# Patient Record
Sex: Female | Born: 2000 | Race: Black or African American | Hispanic: No | Marital: Single | Smoking: Current every day smoker
Health system: Southern US, Community
[De-identification: ages and names within clinical notes are randomized; demographics above are authoritative.]

## PROBLEM LIST (undated history)

## (undated) ENCOUNTER — Inpatient Hospital Stay (HOSPITAL_COMMUNITY): Payer: Self-pay

## (undated) ENCOUNTER — Ambulatory Visit (HOSPITAL_COMMUNITY): Payer: Self-pay | Source: Home / Self Care

## (undated) DIAGNOSIS — J45909 Unspecified asthma, uncomplicated: Secondary | ICD-10-CM

## (undated) DIAGNOSIS — F419 Anxiety disorder, unspecified: Secondary | ICD-10-CM

## (undated) HISTORY — DX: Anxiety disorder, unspecified: F41.9

---
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
## 2020-01-14 ENCOUNTER — Ambulatory Visit (HOSPITAL_COMMUNITY)
Admission: EM | Admit: 2020-01-14 | Discharge: 2020-01-14 | Disposition: A | Discharge status: Home / Self Care | Payer: BC Managed Care – PPO | Attending: Family Medicine | Admitting: Family Medicine

## 2020-01-14 ENCOUNTER — Encounter (HOSPITAL_COMMUNITY): Payer: Self-pay | Admitting: Emergency Medicine

## 2020-01-14 ENCOUNTER — Ambulatory Visit (INDEPENDENT_AMBULATORY_CARE_PROVIDER_SITE_OTHER): Payer: BC Managed Care – PPO

## 2020-01-14 ENCOUNTER — Other Ambulatory Visit: Payer: Self-pay

## 2020-01-14 DIAGNOSIS — M79641 Pain in right hand: Secondary | ICD-10-CM | POA: Diagnosis not present

## 2020-01-14 NOTE — ED Triage Notes (Signed)
Pt sts right thumb pain after over extending 3 days ago

## 2020-01-14 NOTE — Discharge Instructions (Signed)
You x ray was normal You can put ice on the injury.  Rest, ibuprofen for pain and swelling.  Follow up as needed for continued or worsening symptoms

## 2020-01-16 NOTE — ED Provider Notes (Signed)
[facility]    CSN: [id number] Arrival date & time: 01/14/20  1552      History   Chief Complaint Chief Complaint  Patient presents with  . Hand Pain    HPI Katherine Escobar is a 19 y.o. female.   Pt is a 19 year old female that presents with right thumb pain. This has been constant since falling with thumb hyperextended yesterday. Mild swelling and limited ROM. Has not done anything to treat the symptoms. No numbness, tingling.   ROS per HPI      Past Medical History:  Diagnosis Date  . Asthma     There are no problems to display for this patient.   History reviewed. No pertinent surgical history.  OB History   No obstetric history on file.      Home Medications    Prior to Admission medications   Medication Sig Start Date End Date Taking? Authorizing Provider  Cetirizine HCl 10 MG CAPS Take 1 capsule (10 mg total) by mouth at bedtime. 08/23/18   Elvina Sidle, MD  naproxen sodium (ANAPROX) 220 MG tablet Take 220 mg by mouth 2 (two) times daily as needed (pain).    [provider]  triamcinolone cream (KENALOG) 0.1 % Apply 1 application topically 2 (two) times daily. 07/18/19   Elvina Sidle, MD    Family History Family History  Family history unknown: Yes    Social History Social History   Tobacco Use  . Smoking status: Passive Smoke Exposure - Never Smoker  . Smokeless tobacco: Never Used  Substance Use Topics  . Alcohol use: No  . Drug use: No     Allergies   Patient has no known allergies.   Review of Systems Review of Systems   Physical Exam Triage Vital Signs ED Triage Vitals  Enc Vitals Group     BP 01/14/20 1707 (!) 153/77     Pulse Rate 01/14/20 1707 87     Resp 01/14/20 1707 18     Temp 01/14/20 1707 98.1 F (36.7 C)     Temp Source 01/14/20 1707 Oral     SpO2 01/14/20 1707 100 %     Weight --      Height --      Head Circumference --      Peak Flow --      Pain Score 01/14/20 1708 8     Pain  Loc --      Pain Edu? --      Excl. in GC? --    No data found.  Updated Vital Signs BP (!) 153/77 (BP Location: Right Arm)   Pulse 87   Temp 98.1 F (36.7 C) (Oral)   Resp 18   SpO2 100%   Visual Acuity Right Eye Distance:   Left Eye Distance:   Bilateral Distance:    Right Eye Near:   Left Eye Near:    Bilateral Near:     Physical Exam Vitals and nursing note reviewed.  Constitutional:      General: She is not in acute distress.    Appearance: Normal appearance. She is not ill-appearing, toxic-appearing or diaphoretic.  HENT:     Head: Normocephalic.     Nose: Nose normal.  Eyes:     Conjunctiva/sclera: Conjunctivae normal.  Pulmonary:     Effort: Pulmonary effort is normal.  Musculoskeletal:        General: Normal range of motion.     Cervical back: Normal range of  motion.     Comments: Mild tenderness to right thumb, generalized with mild swelling. No deformity or bruising.   Skin:    General: Skin is warm and dry.     Findings: No rash.  Neurological:     Mental Status: She is alert.  Psychiatric:        Mood and Affect: Mood normal.      UC Treatments / Results  Labs (all labs ordered are listed, but only abnormal results are displayed) Labs Reviewed - No data to display  EKG   Radiology DG Finger Thumb Right  Result Date: 01/14/2020 CLINICAL DATA:  Right thumb pain for 3 days EXAM: RIGHT THUMB 2+V COMPARISON:  None. FINDINGS: There is no evidence of fracture or dislocation. There is no evidence of arthropathy or other focal bone abnormality. Soft tissues are unremarkable. IMPRESSION: Negative. Electronically Signed   By: Davina Poke D.O.   On: 01/14/2020 17:19    Procedures Procedures (including critical care time)  Medications Ordered in UC Medications - No data to display  Initial Impression / Assessment and Plan / UC Course  I have reviewed the triage vital signs and the nursing notes.  Pertinent labs & imaging results that were  available during my care of the patient were reviewed by me and considered in my medical decision making (see chart for details).     Right hand pain- x ray negative Rest, ice, ibuprofen Pt requesting splint Given thumb spica here  Follow up as needed for continued or worsening symptoms  Final Clinical Impressions(s) / UC Diagnoses   Final diagnoses:  Hand pain, right     Discharge Instructions     You x ray was normal You can put ice on the injury.  Rest, ibuprofen for pain and swelling.  Follow up as needed for continued or worsening symptoms     ED Prescriptions    None     PDMP not reviewed this encounter.   Orvan July, NP 01/16/20 1112

## 2020-07-03 ENCOUNTER — Other Ambulatory Visit: Payer: Self-pay

## 2020-07-03 ENCOUNTER — Ambulatory Visit (HOSPITAL_COMMUNITY)
Admission: EM | Admit: 2020-07-03 | Discharge: 2020-07-03 | Disposition: A | Discharge status: Home / Self Care | Payer: BC Managed Care – PPO | Attending: Urgent Care | Admitting: Urgent Care

## 2020-07-03 ENCOUNTER — Encounter (HOSPITAL_COMMUNITY): Payer: Self-pay

## 2020-07-03 DIAGNOSIS — R1110 Vomiting, unspecified: Secondary | ICD-10-CM | POA: Diagnosis not present

## 2020-07-03 DIAGNOSIS — R1084 Generalized abdominal pain: Secondary | ICD-10-CM | POA: Diagnosis not present

## 2020-07-03 DIAGNOSIS — Z3202 Encounter for pregnancy test, result negative: Secondary | ICD-10-CM

## 2020-07-03 DIAGNOSIS — N926 Irregular menstruation, unspecified: Secondary | ICD-10-CM

## 2020-07-03 DIAGNOSIS — R112 Nausea with vomiting, unspecified: Secondary | ICD-10-CM

## 2020-07-03 DIAGNOSIS — R109 Unspecified abdominal pain: Secondary | ICD-10-CM

## 2020-07-03 LAB — POCT URINALYSIS DIPSTICK, ED / UC
Bilirubin Urine: NEGATIVE
Glucose, UA: NEGATIVE mg/dL
Hgb urine dipstick: NEGATIVE
Ketones, ur: NEGATIVE mg/dL
Leukocytes,Ua: NEGATIVE
Nitrite: NEGATIVE
Protein, ur: NEGATIVE mg/dL
Specific Gravity, Urine: 1.025 (ref 1.005–1.030)
Urobilinogen, UA: 0.2 mg/dL (ref 0.0–1.0)
pH: 7 (ref 5.0–8.0)

## 2020-07-03 LAB — POC URINE PREG, ED: Preg Test, Ur: NEGATIVE

## 2020-07-03 NOTE — Discharge Instructions (Signed)
Try to establish care with a gynecologist and see about more work up for your irregular cycle. They may be able to do a blood pregnancy test as well. Do not use any nonsteroidal anti-inflammatories (NSAIDs) like ibuprofen, Motrin, naproxen, Aleve, etc. which are all available over-the-counter.  Please just use Tylenol at a dose of 500mg -650mg  once every 6 hours as needed for your aches, pains, fevers.

## 2020-07-03 NOTE — ED Provider Notes (Signed)
[facility]   MRN: [id number] DOB: 09-25-2001  Subjective:   Katherine Escobar is a 19 y.o. female presenting for having had an irregular cycle this month on 06/11/2020.  Patient states she had bleeding for 1 day.  This not normal for her.  She also has had some intermittent abdominal cramping, nausea and vomiting in the past 2 days.  She took multiple home pregnancy test and some were negative, so more positive.  She would like to have this confirmed today.  No current facility-administered medications for this encounter.  Current Outpatient Medications:  .  Cetirizine HCl 10 MG CAPS, Take 1 capsule (10 mg total) by mouth at bedtime., Disp: 30 capsule, Rfl: 1 .  naproxen sodium (ANAPROX) 220 MG tablet, Take 220 mg by mouth 2 (two) times daily as needed (pain)., Disp: , Rfl:  .  triamcinolone cream (KENALOG) 0.1 %, Apply 1 application topically 2 (two) times daily., Disp: 453 g, Rfl: 0   No Known Allergies  Past Medical History:  Diagnosis Date  . Asthma      History reviewed. No pertinent surgical history.  Family History  Problem Relation Age of Onset  . Hypertension Mother   . Hyperlipidemia Mother     Social History   Tobacco Use  . Smoking status: Passive Smoke Exposure - Never Smoker  . Smokeless tobacco: Never Used  Substance Use Topics  . Alcohol use: Not Currently  . Drug use: Not Currently    ROS   Objective:   Vitals: BP 127/81 (BP Location: Right Arm)   Pulse 72   Temp 98.6 F (37 C) (Oral)   Resp 19   LMP 06/11/2020   SpO2 100%   Physical Exam Constitutional:      General: She is not in acute distress.    Appearance: Normal appearance. She is well-developed and normal weight. She is not ill-appearing, toxic-appearing or diaphoretic.  HENT:     Head: Normocephalic and atraumatic.     Right Ear: External ear normal.     Left Ear: External ear normal.     Nose: Nose normal.     Mouth/Throat:     Mouth: Mucous membranes are moist.      Pharynx: Oropharynx is clear.  Eyes:     General: No scleral icterus.    Extraocular Movements: Extraocular movements intact.     Pupils: Pupils are equal, round, and reactive to light.  Cardiovascular:     Rate and Rhythm: Normal rate and regular rhythm.     Heart sounds: Normal heart sounds. No murmur heard.  No friction rub. No gallop.   Pulmonary:     Effort: Pulmonary effort is normal. No respiratory distress.     Breath sounds: Normal breath sounds. No stridor. No wheezing, rhonchi or rales.  Abdominal:     General: Bowel sounds are normal. There is no distension.     Palpations: Abdomen is soft. There is no mass.     Tenderness: There is abdominal tenderness (generalized). There is no right CVA tenderness, left CVA tenderness, guarding or rebound.  Skin:    General: Skin is warm and dry.     Coloration: Skin is not pale.     Findings: No rash.  Neurological:     General: No focal deficit present.     Mental Status: She is alert and oriented to person, place, and time.  Psychiatric:        Mood and Affect: Mood normal.  Behavior: Behavior normal.        Thought Content: Thought content normal.        Judgment: Judgment normal.     Results for orders placed or performed during the hospital encounter of 07/03/20 (from the past 24 hour(s))  POC urine pregnancy     Status: None   Collection Time: 07/03/20 12:27 PM  Result Value Ref Range   Preg Test, Ur NEGATIVE NEGATIVE  POC Urinalysis dipstick     Status: None   Collection Time: 07/03/20 12:29 PM  Result Value Ref Range   Glucose, UA NEGATIVE NEGATIVE mg/dL   Bilirubin Urine NEGATIVE NEGATIVE   Ketones, ur NEGATIVE NEGATIVE mg/dL   Specific Gravity, Urine 1.025 1.005 - 1.030   Hgb urine dipstick NEGATIVE NEGATIVE   pH 7.0 5.0 - 8.0   Protein, ur NEGATIVE NEGATIVE mg/dL   Urobilinogen, UA 0.2 0.0 - 1.0 mg/dL   Nitrite NEGATIVE NEGATIVE   Leukocytes,Ua NEGATIVE NEGATIVE    Assessment and Plan :   PDMP not  reviewed this encounter.  1. Nausea and vomiting, intractability of vomiting not specified, unspecified vomiting type   2. Generalized abdominal pain   3. Abdominal cramping   4. Irregular menstrual cycle     No alarming physical exam findings.  Recommended [facility] care through [facility] with a gynecologist for further work-up on a regular cycle.  Unfortunately we are not doing blood pregnancy test in the clinic as of been informed about this through our lab.  Recommend supportive care, hydration, Tylenol for aches and pains. Counseled patient on potential for adverse effects with medications prescribed/recommended today, ER and return-to-clinic precautions discussed, patient verbalized understanding.    Wallis Bamberg, PA-C 07/03/20 1247

## 2020-07-03 NOTE — ED Triage Notes (Signed)
Pt is here after a 1 day period on 06/11/2020, pt states this is not normal for her cycle. Pt vomited twice yesterday morning & wants a pregnancy test today

## 2020-12-22 ENCOUNTER — Encounter: Payer: Self-pay | Admitting: Emergency Medicine

## 2020-12-22 ENCOUNTER — Other Ambulatory Visit: Payer: Self-pay

## 2020-12-22 ENCOUNTER — Ambulatory Visit
Admission: EM | Admit: 2020-12-22 | Discharge: 2020-12-22 | Disposition: A | Discharge status: Home / Self Care | Payer: BC Managed Care – PPO | Attending: Family Medicine | Admitting: Family Medicine

## 2020-12-22 DIAGNOSIS — R519 Headache, unspecified: Secondary | ICD-10-CM | POA: Diagnosis not present

## 2020-12-22 DIAGNOSIS — Z3202 Encounter for pregnancy test, result negative: Secondary | ICD-10-CM

## 2020-12-22 LAB — POCT URINE PREGNANCY: Preg Test, Ur: NEGATIVE

## 2020-12-22 NOTE — ED Triage Notes (Signed)
Patient c/o vomiting and headache x 3 days.   Patient endorses she wants a pregnancy test.   Patient states she had some bleeding 3 days ago which subsided quickly.   Patient denies any ABD pain.   Patient denies taking any BC methods.

## 2020-12-24 NOTE — ED Provider Notes (Signed)
  [facility]   [id number] 12/22/20 Arrival Time: 1425  ASSESSMENT & PLAN:  1. Nonintractable headache, unspecified chronicity pattern, unspecified headache type   2. Negative pregnancy test     Tylenol for HA if needed. Reports HA has improved. UPT negative.     Follow-up Information    [facility] .   Specialty: Urgent Care Why: As needed. Contact information: [address] [id number] [address] [place] [address] [phone]              Reviewed expectations re: course of current medical issues. Questions answered. Outlined signs and symptoms indicating need for more acute intervention. Understanding verbalized. After Visit Summary given.   SUBJECTIVE: History from: patient. Katherine Escobar is a 20 y.o. female who reports a headache; mild; over past 2-3 days; comes and goes. Not worst HA of life. Requests UPT. Patient's last menstrual period was 12/19/2020. Denies: fever and difficulty breathing. Normal PO intake without n/v/d.    OBJECTIVE:  Vitals:   12/22/20 1435  BP: 113/71  Pulse: 83  Resp: 14  Temp: 98.8 F (37.1 C)  TempSrc: Oral  SpO2: 99%    General appearance: alert; no distress Eyes: PERRLA; EOMI; conjunctiva normal HENT: [place]; AT; without nasal congestion Neck: supple  Lungs: speaks full sentences without difficulty; unlabored Extremities: no edema Skin: warm and dry Neurologic: normal gait; CN 2-12 grossly intact Psychological: alert and cooperative; normal mood and affect  Labs: Results for orders placed or performed during the hospital encounter of 12/22/20  POCT urine pregnancy  Result Value Ref Range   Preg Test, Ur Negative Negative   Labs Reviewed  POCT URINE PREGNANCY    No Known Allergies  Past Medical History:  Diagnosis Date  . Asthma    Social History   Socioeconomic History  . Marital status: Single    Spouse name: Not on file  . Number of  children: Not on file  . Years of education: Not on file  . Highest education level: Not on file  Occupational History  . Not on file  Tobacco Use  . Smoking status: Passive Smoke Exposure - Never Smoker  . Smokeless tobacco: Never Used  Substance and Sexual Activity  . Alcohol use: Not Currently  . Drug use: Not Currently  . Sexual activity: Yes    Birth control/protection: None  Other Topics Concern  . Not on file  Social History Narrative  . Not on file   Social Determinants of Health   Financial Resource Strain: Not on file  Food Insecurity: Not on file  Transportation Needs: Not on file  Physical Activity: Not on file  Stress: Not on file  Social Connections: Not on file  Intimate Partner Violence: Not on file   Family History  Problem Relation Age of Onset  . Hypertension Mother   . Hyperlipidemia Mother    History reviewed. No pertinent surgical history.   Mardella Layman, MD 12/24/20 [phone]

## 2021-02-12 ENCOUNTER — Encounter: Payer: Self-pay | Admitting: Obstetrics & Gynecology

## 2021-02-12 ENCOUNTER — Ambulatory Visit (INDEPENDENT_AMBULATORY_CARE_PROVIDER_SITE_OTHER): Payer: BC Managed Care – PPO | Admitting: Obstetrics & Gynecology

## 2021-02-12 ENCOUNTER — Other Ambulatory Visit: Payer: Self-pay

## 2021-02-12 ENCOUNTER — Other Ambulatory Visit (HOSPITAL_COMMUNITY)
Admission: RE | Admit: 2021-02-12 | Discharge: 2021-02-12 | Disposition: A | Discharge status: Home / Self Care | Payer: BC Managed Care – PPO | Source: Ambulatory Visit | Attending: Obstetrics & Gynecology | Admitting: Obstetrics & Gynecology

## 2021-02-12 VITALS — BP 134/83 | HR 88 | Ht 62.0 in | Wt 273.0 lb

## 2021-02-12 DIAGNOSIS — Z113 Encounter for screening for infections with a predominantly sexual mode of transmission: Secondary | ICD-10-CM | POA: Diagnosis not present

## 2021-02-12 DIAGNOSIS — N912 Amenorrhea, unspecified: Secondary | ICD-10-CM

## 2021-02-12 DIAGNOSIS — A749 Chlamydial infection, unspecified: Secondary | ICD-10-CM

## 2021-02-12 HISTORY — DX: Chlamydial infection, unspecified: A74.9

## 2021-02-12 LAB — POCT URINE PREGNANCY: Preg Test, Ur: NEGATIVE

## 2021-02-12 NOTE — Progress Notes (Signed)
GYNECOLOGY OFFICE VISIT NOTE  History:   Katherine Escobar is a 20 y.o. G0P0000 here today for evaluation of amenorrhea since 10/2020. Thought she was pregnant, but had negative pregnancy test in 12/2020. UPT today was also negative here in office.  She desired evaluation for this abnormal period pattern and also to discuss birth control.  Also desires STI screen.   Patient reports having occasional missed periods since menarche. Normal flow, no other concerns. She denies any abnormal vaginal discharge, bleeding, pelvic pain or other concerns.    Past Medical History:  Diagnosis Date  . Asthma     History reviewed. No pertinent surgical history.  The following portions of the patient's history were reviewed and updated as appropriate: allergies, current medications, past family history, past medical history, past social history, past surgical history and problem list.   Health Maintenance:  Unsure if she received HPV vaccine, was in [facility] as a child, will ask mother.   Review of Systems:  Pertinent items noted in HPI and remainder of comprehensive ROS otherwise negative.  Physical Exam:  BP 134/83   Pulse 88   Ht 5\' 2"  (1.575 m)   Wt 273 lb (123.8 kg)   BMI 49.93 kg/m  CONSTITUTIONAL: Well-developed, well-nourished female in no acute distress.  HEENT:  Normocephalic, atraumatic. External right and left ear normal. No scleral icterus.  NECK: Normal range of motion, supple, no masses noted on observation SKIN: No rash noted. Not diaphoretic. No erythema. No pallor. MUSCULOSKELETAL: Normal range of motion. No edema noted. NEUROLOGIC: Alert and oriented to person, place, and time. Normal muscle tone coordination. No cranial nerve deficit noted. PSYCHIATRIC: Normal mood and affect. Normal behavior. Normal judgment and thought content. CARDIOVASCULAR: Normal heart rate noted RESPIRATORY: Effort and breath sounds normal, no problems with respiration noted ABDOMEN: No masses noted. No other  overt distention noted.   PELVIC: Deferred  Labs and Imaging Results for orders placed or performed in visit on 02/12/21 (from the past 168 hour(s))  POCT urine pregnancy   Collection Time: 02/12/21  3:12 PM  Result Value Ref Range   Preg Test, Ur Negative Negative   No results found.    Assessment and Plan:      1. Amenorrhea 2.  Morbid obesity (HCC) Negative pregnancy test today.  Patient has oligomenorrhea, concerning for PCOS. Also morbidly obese. Denies having any acne or abnormal hair growth.  Will order abnormal uterine bleeding evaluation labs and pelvic ultrasound to evaluate for any structural gynecologic abnormalities.  Will contact patient with these results and plans for further evaluation/management.  Counseled patient about management of amenorrhea in the setting of morbid obesity, recommended weight loss which helps with restoring ovulatory cycles, decreases glucose intolerance with improvement of metabolic risk, improves fertility/pregnancy rates and helps with overall health.  Even modest weight loss (5 to 10 percent reduction in body weight) in women with may result in these effects.  OCPs can also be used for managing menstrual dysfunction and for providing contraception.  Will use low dose OCPs given borderline hypertension today, reevaluate in two months. She was given two samples of 20 mcg estradiol pill Tyblume. These treatments are also used for PCOS.  If she does meet criteria for PCOS and or prediabetes, may consider treatment with Metformin given its association with glucose intolerance and insulin resistance.   - POCT urine pregnancy - TSH+Prl+TestT+TestF+17OHP - 02/14/21 PELVIC COMPLETE WITH TRANSVAGINAL; Future - Hemoglobin A1c  2. Routine screening for STI (sexually transmitted infection) STI  screen done, will follow up results and manage accordingly. - Urine cytology ancillary only([place]) - Hepatitis B surface antigen - Hepatitis C antibody - HIV Antibody  (routine testing w rflx) - RPR Routine preventative health maintenance measures emphasized. Please refer to After Visit Summary for other counseling recommendations.   Return in about 2 months (around 04/14/2021) for Followup amenorrhea.    I spent 30 minutes dedicated to the care of this patient including pre-visit review of records, face to face time with the patient discussing her conditions and treatments and post visit ordering of testing.    Jaynie Collins, MD, FACOG Obstetrician & Gynecologist, [facility] for [employer], [facility]

## 2021-02-12 NOTE — Patient Instructions (Addendum)
Oral Contraception Use Oral contraceptive pills (OCPs) are medicines that prevent pregnancy. OCPs work by:  Preventing the ovaries from releasing eggs.  Thickening mucus in the lower part of the uterus (cervix). This prevents sperm from entering the uterus.  Thinning the lining of the uterus (endometrium). This prevents a fertilized egg from attaching to the endometrium. Discuss possible side effects of OCPs with your health care provider. It can take 2-3 months for your body to adjust to changes in hormone levels. What are the risks? OCPs can sometimes cause side effects, such as:  Headache.  Depression.  Trouble sleeping.  Nausea and vomiting.  Breast tenderness.  Irregular bleeding or spotting during the first several months.  Bloating or fluid retention.  Increase in blood pressure. OCPs with estrogen and progestins may slightly increase the risk of:  Blood clots.  Heart attack.  Stroke. How to take OCPs Follow instructions from your health care provider about how to take your first cycle of OCPs. There are 2 types of OCPs. The first, combination OCPs, have both estrogen and progestins. The second, progestin-only pills, have only progestin.  For combination OCPs, you may start the pill: ? On day 1 of your menstrual period. ? On the first Sunday after your period starts, or on the day you get your prescription. ? At any time of your cycle. ? If you start taking the pill within 5 days after the start of your period, you will not need a backup form of birth control, such as condoms. ? If you start at any other time of your menstrual cycle, you will need to use a backup form of birth control.  For progestin-only OCPs: ? Ideally, you can start taking the pill on the first day of your menstrual period, but you can start it on any other day too. ? These pills will protect you from pregnancy after taking it for 2 days (48 hours). You can stop using a backup form of birth  control after that time. It is important that you take this pill at the same time every day. Even taking it 3 hours late can increase the risk of pregnancy. No matter which day you start the OCP, you will always start a new pack on that same day of the week. Have an extra pack of OCPs and a backup contraceptive method available in case you miss some pills or lose your OCP pack. Missed doses Follow instructions from your health care provider for missed doses. Information about missed doses can also be found in the patient information sheet that comes with your pack of pills.  In general, for combined OCPs: ? If you forget to take the pill for 1 day, take it as soon as you can. This may mean taking 2 pills on the same day and at the same time. Take the next day's pill at the regular time. ? If you forget to take the pill for 2 days in a row, take 2 tablets on the day you remember and 2 tablets on the following day. A backup form of birth control should be used for 7 days after you are back on schedule. ? If you forget to take the pill for 3 days in a row, call your health care provider for directions on when to restart taking your pills. Do not take the missed pills. A backup form of birth control will be needed for 7 days once you restart your pills. ? If you use a pack that   contains inactive pills and you miss 1 or more of the inactive pills, you do not need to take the missed doses. Skip them and start the new pack on the regular day.  For progestin-only OCPs: ? If your dose is 3 hours or more late, or if you miss 1 or more doses, take 1 missed pill as soon as you can. ? If you miss one or more doses, you must use a backup form of birth control. Some brands of progestin-only pills recommend using a backup form of birth control for 48 hours after a missed or late dose while others recommend 7 days. If you are not sure what to do, call your health care provider or check the patient information sheet that  came with your pills.   Follow these instructions at home:  Do not use any products that contain nicotine or tobacco. These include cigarettes, chewing tobacco, or vaping devices, such as e-cigarettes. If you need help quitting, ask your health care provider.  Always use a condom to protect against STIs (sexually transmitted infections). Oral contraception pills do not protect against STIs.  Use a calendar to mark the days of your menstrual period.  Read the information sheet and directions that came with your OCP. Talk to your health care provider if you have questions. Contact a health care provider if:  You develop nausea and vomiting.  You have abnormal vaginal discharge or bleeding.  You develop a rash.  You miss your menstrual period. Depending on the type of OCP you are taking, this may be a sign of pregnancy.  You are losing your hair.  You need treatment for mood swings or depression.  You get dizzy when taking the OCP.  You develop acne after taking the OCP.  You become pregnant or think you may be pregnant.  You have diarrhea, constipation, and abdominal pain or cramps.  You are not sure what to do after missing pills. Get help right away if:  You develop chest pain.  You develop shortness of breath.  You have an uncontrolled or severe headache.  You develop numbness or slurred speech.  You develop vision or speech problems.  You develop pain, redness, and swelling in your legs.  You develop weakness or numbness in your arms or legs. These symptoms may represent a serious problem that is an emergency. Do not wait to see if the symptoms will go away. Get medical help right away. Call your local emergency services (911 in the [place] Do not drive yourself to the hospital. Summary  Oral contraceptive pills (OCPs) are medicines that you take to prevent pregnancy.  OCPs do not prevent sexually transmitted infections (STIs). Always use a condom to protect  against STIs.  When you start an OCP, be aware that it can take 2-3 months for your body to adjust to changes in hormone levels.  Read all the information and directions that come with your OCP. This information is not intended to replace advice given to you by your health care provider. Make sure you discuss any questions you have with your health care provider. Document Revised: 06/21/2020 Document Reviewed: 06/21/2020 Elsevier Patient Education  2021 Elsevier Inc.   Polycystic Ovary Syndrome  Polycystic ovarian syndrome (PCOS) is a common hormonal disorder among women of reproductive age. In most women with PCOS, small fluid-filled sacs (cysts) grow on the ovaries. PCOS can cause problems with menstrual periods and make it hard to get and stay pregnant. If this condition is  not treated, it can lead to serious health problems, such as diabetes and heart disease. What are the causes? The cause of this condition is not known. It may be due to certain factors, such as:  Irregular menstrual cycle.  High levels of certain hormones.  Problems with the hormone that helps to control blood sugar (insulin).  Certain genes. What increases the risk? You are more likely to develop this condition if you:  Have a family history of PCOS or type 2 diabetes.  Are overweight, eat unhealthy foods, and are not active. These factors may cause problems with blood sugar control, which can contribute to PCOS or PCOS symptoms. What are the signs or symptoms? Symptoms of this condition include:  Ovarian cysts and sometimes pelvic pain.  Menstrual periods that are not regular or are too heavy.  Inability to get or stay pregnant.  Increased growth of hair on the face, chest, stomach, back, thumbs, thighs, or toes.  Acne or oily skin. Acne may develop during adulthood, and it may not get better with treatment.  Weight gain or obesity.  Patches of thickened and dark brown or black skin on the neck,  arms, breasts, or thighs. How is this diagnosed? This condition is diagnosed based on:  Your medical history.  A physical exam that includes a pelvic exam. Your health care provider may look for areas of increased hair growth on your skin.  Tests, such as: ? An ultrasound to check the ovaries for cysts and to view the lining of the uterus. ? Blood tests to check levels of sugar (glucose), female hormone (testosterone), and female hormones (estrogen and progesterone). How is this treated? There is no cure for this condition, but treatment can help to manage symptoms and prevent more health problems from developing. Treatment varies depending on your symptoms and if you want to have a baby or if you need birth control. Treatment may include:  Making nutrition and lifestyle changes.  Taking the progesterone hormone to start a menstrual period.  Taking birth control pills to help you have regular menstrual periods.  Taking medicines such as: ? Medicines to make you ovulate, if you want to get pregnant. ? Medicine to reduce extra hair growth.  Having surgery in severe cases. This may involve making small holes in one or both of your ovaries. This decreases the amount of testosterone that your body makes. Follow these instructions at home:  Take over-the-counter and prescription medicines only as told by your health care provider.  Follow a healthy meal plan that includes lean proteins, complex carbohydrates, fresh fruits and vegetables, low-fat dairy products, healthy fats, and fiber.  If you are overweight, lose weight as told by your health care provider. Your health care provider can determine how much weight loss is best for you and can help you lose weight safely.  Keep all follow-up visits. This is important. Contact a health care provider if:  Your symptoms do not get better with medicine.  Your symptoms get worse or you develop new symptoms. Summary  Polycystic ovarian  syndrome (PCOS) is a common hormonal disorder among women of reproductive age.  PCOS can cause problems with menstrual periods and make it hard to get and stay pregnant.  If this condition is not treated, it can lead to serious health problems, such as diabetes and heart disease.  There is no cure for this condition, but treatment can help to manage symptoms and prevent more health problems from developing. This information is  not intended to replace advice given to you by your health care provider. Make sure you discuss any questions you have with your health care provider. Document Revised: 03/22/2020 Document Reviewed: 03/22/2020 Elsevier Patient Education  2021 Elsevier Inc.   HPV (Human Papillomavirus) Vaccine: What You Need to Know 1. Why get vaccinated? HPV (human papillomavirus) vaccine can prevent infection with some types of human papillomavirus. HPV infections can cause certain types of cancers, including:  cervical, vaginal, and vulvar cancers in women  penile cancer in men  anal cancers in both men and women  cancers of tonsils, base of tongue, and back of throat (oropharyngeal cancer) in both men and women HPV infections can also cause anogenital warts. HPV vaccine can prevent over 90% of cancers caused by HPV. HPV is spread through intimate skin-to-skin or sexual contact. HPV infections are so common that nearly all people will get at least one type of HPV at some time in their lives. Most HPV infections go away on their own within 2 years. But sometimes HPV infections will last longer and can cause cancers later in life. 2. HPV vaccine HPV vaccine is routinely recommended for adolescents at 311 or 20 years of age to ensure they are protected before they are exposed to the virus. HPV vaccine may be given beginning at age 469 years and vaccination is recommended for everyone through 20 years of age. HPV vaccine may be given to adults 27 through 20 years of age, based on  discussions between the patient and health care provider. Most children who get the first dose before 20 years of age need 2 doses of HPV vaccine. People who get the first dose at or after 20 years of age and younger people with certain immunocompromising conditions need 3 doses. Your health care provider can give you more information. HPV vaccine may be given at the same time as other vaccines. 3. Talk with your health care provider Tell your vaccination provider if the person getting the vaccine:  Has had an allergic reaction after a previous dose of HPV vaccine, or has any severe, life-threatening allergies  Is pregnant--HPV vaccine is not recommended until after pregnancy In some cases, your health care provider may decide to postpone HPV vaccination until a future visit. People with minor illnesses, such as a cold, may be vaccinated. People who are moderately or severely ill should usually wait until they recover before getting HPV vaccine. Your health care provider can give you more information. 4. Risks of a vaccine reaction  Soreness, redness, or swelling where the shot is given can happen after HPV vaccination.  Fever or headache can happen after HPV vaccination. People sometimes faint after medical procedures, including vaccination. Tell your provider if you feel dizzy or have vision changes or ringing in the ears. As with any medicine, there is a very remote chance of a vaccine causing a severe allergic reaction, other serious injury, or death. 5. What if there is a serious problem? An allergic reaction could occur after the vaccinated person leaves the clinic. If you see signs of a severe allergic reaction (hives, swelling of the face and throat, difficulty breathing, a fast heartbeat, dizziness, or weakness), call 9-1-1 and get the person to the nearest hospital. For other signs that concern you, call your health care provider. Adverse reactions should be reported to the Vaccine  Adverse Event Reporting System (VAERS). Your health care provider will usually file this report, or you can do it yourself. Visit the VAERS  website at www.vaers.LAgents.no or call 443 634 9537. VAERS is only for reporting reactions, and VAERS staff members do not give medical advice. 6. The National Vaccine Injury Compensation Program The [employer] Vaccine Injury Compensation Program (VICP) is a federal program that was created to compensate people who may have been injured by certain vaccines. Claims regarding alleged injury or death due to vaccination have a time limit for filing, which may be as short as two years. Visit the VICP website at SpiritualWord.at or call (317) 276-9531 to learn about the program and about filing a claim. 7. How can I learn more?  Ask your health care provider.  Call your local or state health department.  Visit the website of the Food and Drug Administration (FDA) for vaccine package inserts and additional information at FinderList.no.  Contact the Centers for Disease Control and Prevention (CDC): ? Call 7347859782 (1-800-CDC-INFO) or ? Visit CDC's website at PicCapture.uy. Vaccine Information Statement HPV Vaccine (06/01/2020) This information is not intended to replace advice given to you by your health care provider. Make sure you discuss any questions you have with your health care provider. Document Revised: 07/10/2020 Document Reviewed: 07/10/2020 Elsevier Patient Education  2021 [employer].     Thank you for enrolling in [place]. Please follow the instructions below to securely access your online medical record. MyChart allows you to send messages to your doctor, view your test results, manage appointments, and more.   How Do I Sign Up? 1. In your Internet browser, go to [employer] and enter [email or web address]. 2. Click on the Sign Up Now link in the Sign In box. You will see the  New Member Sign Up page. 3. Enter your MyChart Access Code exactly as it appears below. You will not need to use this code after you've completed the sign-up process. If you do not sign up before the expiration date, you must request a new code.  MyChart Access Code: [email or web address] Expires: 03/29/2021  3:35 PM  4. Enter your Social Security Number ([id number]) and Date of Birth (mm/dd/yyyy) as indicated and click Submit. You will be taken to the next sign-up page. 5. Create a MyChart ID. This will be your MyChart login ID and cannot be changed, so think of one that is secure and easy to remember. 6. Create a MyChart password. You can change your password at any time. 7. Enter your Password Reset Question and Answer. This can be used at a later time if you forget your password.  8. Enter your e-mail address. You will receive e-mail notification when new information is available in MyChart. 9. Click Sign Up. You can now view your medical record.   Additional Information Remember, MyChart is NOT to be used for urgent needs. For medical emergencies, dial 911.

## 2021-02-13 LAB — HEMOGLOBIN A1C
Est. average glucose Bld gHb Est-mCnc: 117 mg/dL
Hgb A1c MFr Bld: 5.7 % — ABNORMAL HIGH (ref 4.8–5.6)

## 2021-02-14 ENCOUNTER — Encounter: Payer: Self-pay | Admitting: Obstetrics & Gynecology

## 2021-02-14 LAB — URINE CYTOLOGY ANCILLARY ONLY
Chlamydia: POSITIVE — AB
Comment: NEGATIVE
Comment: NEGATIVE
Comment: NORMAL
Neisseria Gonorrhea: NEGATIVE
Trichomonas: NEGATIVE

## 2021-02-14 MED ORDER — DOXYCYCLINE HYCLATE 100 MG PO CAPS: 100.0000 mg | ORAL_CAPSULE | Freq: Two times a day (BID) | ORAL | 1 refills | Status: AC

## 2021-02-14 NOTE — Progress Notes (Signed)
Patient has chlamydia.  Recommend testing for other STIs, also needs to let partner(s) know so the partner(s) can get testing and treatment. Patient and sex partner(s) should abstain from unprotected sexual activity for at least seven days after everyone receives appropriate treatment.  Doxycyline was prescribed for patient.  Patient will need to return in about 4 weeks after treatment for repeat test of cure.  Please call to inform patient of results and recommendations, and advise to pick up prescription and take as directed.  Please advise patient to practice safe sex at all times.

## 2021-02-14 NOTE — Addendum Note (Signed)
Addended by: Jaynie Collins A on: 02/14/2021 03:21 PM   Modules accepted: Orders

## 2021-02-18 ENCOUNTER — Telehealth: Payer: Self-pay

## 2021-02-18 NOTE — Telephone Encounter (Signed)
Pt called and given results. Pt advised to abstain from any unprotected sexual intercourse for at least 7-10 days after pt and partner(s) receive treatment. Pt instructed to let partner(s) know so they can be tested and treated. Pt scheduled for TOC in 4 weeks per Dr. Macon Large recommendation. Pt has no further questions at this time and verbalizes understanding of results.

## 2021-02-19 ENCOUNTER — Telehealth: Payer: Self-pay

## 2021-02-19 ENCOUNTER — Other Ambulatory Visit: Payer: Self-pay

## 2021-02-19 DIAGNOSIS — A749 Chlamydial infection, unspecified: Secondary | ICD-10-CM

## 2021-02-19 MED ORDER — DOXYCYCLINE CALCIUM 50 MG/5ML PO SYRP: 100.0000 mg | ORAL_SOLUTION | Freq: Two times a day (BID) | ORAL | Status: AC

## 2021-02-19 NOTE — Telephone Encounter (Signed)
Pt called office stating she could not swallow the doxycycline capsule. Doxycycline syrup sent in per Dr. Jaynie Collins. Pt notified

## 2021-02-20 ENCOUNTER — Other Ambulatory Visit: Payer: Self-pay | Admitting: *Deleted

## 2021-02-20 ENCOUNTER — Telehealth: Payer: Self-pay

## 2021-02-20 NOTE — Progress Notes (Signed)
erroneous

## 2021-02-20 NOTE — Telephone Encounter (Signed)
Pt called the office stating her pharmacy did not have the syrup form of Doxycyline. Pharmacy states they can not get syrup form, [facility] directed me to [facility].   Called [facility] states they can not get syrup form and unsure of who does.  Called pt backed and explained syrup form is near impossible to get. Informed pt I had reached out to Jaynie Collins, MD to see an alternative.  Pt states she did not know what the medication was for and the office waited until the 26th to give her results. Advised pt that is not true, 2 attempts were made to contact her (refer to documentation) and a My Chart message was sent. Also informed pt she spoke with me on the 25th and I verbally gave her the lab results and had advised pt antibiotic was sent in. Pt requested syrup form of medication yesterday (02/19/2021).  Pt verbally states she will take the capsule form of doxycycline. Advised pt to please call the office if there is still an issue with the medication.

## 2021-02-21 LAB — TSH+PRL+TESTT+TESTF+17OHP
17-Hydroxyprogesterone: 11 ng/dL
Prolactin: 18.7 ng/mL (ref 4.8–23.3)
TSH: 0.473 u[IU]/mL (ref 0.450–4.500)
Testosterone, Free: 3.3 pg/mL (ref 0.0–4.2)
Testosterone, Total, LC/MS: 28.2 ng/dL (ref 10.0–55.0)

## 2021-02-21 LAB — RPR: RPR Ser Ql: NONREACTIVE

## 2021-02-21 LAB — HEPATITIS B SURFACE ANTIGEN: Hepatitis B Surface Ag: NEGATIVE

## 2021-02-21 LAB — HEPATITIS C ANTIBODY: Hep C Virus Ab: <0.1 s/co ratio (ref 0.0–0.9)

## 2021-02-21 LAB — HIV ANTIBODY (ROUTINE TESTING W REFLEX): HIV Screen 4th Generation wRfx: NONREACTIVE

## 2021-03-06 ENCOUNTER — Ambulatory Visit: Admission: RE | Admit: 2021-03-06 | Payer: BC Managed Care – PPO | Source: Ambulatory Visit

## 2021-03-19 ENCOUNTER — Other Ambulatory Visit (HOSPITAL_COMMUNITY)
Admission: RE | Admit: 2021-03-19 | Discharge: 2021-03-19 | Disposition: A | Discharge status: Home / Self Care | Payer: BC Managed Care – PPO | Source: Ambulatory Visit | Attending: Obstetrics & Gynecology | Admitting: Obstetrics & Gynecology

## 2021-03-19 ENCOUNTER — Other Ambulatory Visit: Payer: Self-pay

## 2021-03-19 ENCOUNTER — Ambulatory Visit (INDEPENDENT_AMBULATORY_CARE_PROVIDER_SITE_OTHER): Payer: BC Managed Care – PPO

## 2021-03-19 VITALS — BP 131/77 | HR 91

## 2021-03-19 DIAGNOSIS — Z113 Encounter for screening for infections with a predominantly sexual mode of transmission: Secondary | ICD-10-CM | POA: Diagnosis present

## 2021-03-19 DIAGNOSIS — Z8619 Personal history of other infectious and parasitic diseases: Secondary | ICD-10-CM | POA: Diagnosis not present

## 2021-03-19 DIAGNOSIS — A749 Chlamydial infection, unspecified: Secondary | ICD-10-CM | POA: Diagnosis not present

## 2021-03-19 NOTE — Progress Notes (Signed)
Patient was assessed and managed by nursing staff during this encounter. I have reviewed the chart and agree with the documentation and plan.   Jaynie Collins, MD 03/19/2021 3:41 PM

## 2021-03-19 NOTE — Progress Notes (Signed)
SUBJECTIVE:  20 y.o. presents to office for TOC.   No LMP recorded. (Menstrual status: Irregular Periods).  OBJECTIVE:  She appears well, afebrile. Urine dipstick: not done.  ASSESSMENT:  Hx of Chlamydia   PLAN:  GC, chlamydia, trichomonas, probe sent to lab. Treatment: To be determined once lab results are received

## 2021-03-21 LAB — CERVICOVAGINAL ANCILLARY ONLY
Chlamydia: NEGATIVE
Comment: NEGATIVE
Comment: NEGATIVE
Comment: NORMAL
Neisseria Gonorrhea: NEGATIVE
Trichomonas: NEGATIVE

## 2021-04-09 ENCOUNTER — Ambulatory Visit: Payer: BC Managed Care – PPO | Admitting: Obstetrics & Gynecology

## 2021-05-19 ENCOUNTER — Ambulatory Visit
Admission: EM | Admit: 2021-05-19 | Discharge: 2021-05-19 | Disposition: A | Discharge status: Home / Self Care | Payer: BC Managed Care – PPO | Attending: Urgent Care | Admitting: Urgent Care

## 2021-05-19 DIAGNOSIS — M436 Torticollis: Secondary | ICD-10-CM

## 2021-05-19 DIAGNOSIS — M62838 Other muscle spasm: Secondary | ICD-10-CM

## 2021-05-19 MED ORDER — NAPROXEN 500 MG PO TABS: 500.0000 mg | ORAL_TABLET | Freq: Two times a day (BID) | ORAL | 0 refills | Status: DC

## 2021-05-19 MED ORDER — TIZANIDINE HCL 4 MG PO TABS: 4.0000 mg | ORAL_TABLET | Freq: Four times a day (QID) | ORAL | 0 refills | Status: DC | PRN

## 2021-05-19 NOTE — ED Triage Notes (Signed)
Pt presents with left side shoulder & neck pain with no known injury; pt states she does mild lifting at work.

## 2021-05-19 NOTE — ED Provider Notes (Signed)
[facility]   MRN: [id number] DOB: Apr 11, 2001  Subjective:   Katherine Escobar is a 20 y.o. female presenting for 2-day history of acute onset persistent and worsening left-sided neck pain with shoulder pain, upper back pain.  Has not used any medications for relief.  Denies falls, trauma, numbness or tingling, weakness.  She reports that she works at [employer] where she has to stand for long periods of time and does a lot of lifting of grocery items.  No current facility-administered medications for this encounter.  Current Outpatient Medications:    Cetirizine HCl 10 MG CAPS, Take 1 capsule (10 mg total) by mouth at bedtime., Disp: 30 capsule, Rfl: 1   naproxen sodium (ANAPROX) 220 MG tablet, Take 220 mg by mouth 2 (two) times daily as needed (pain). (Patient not taking: Reported on 02/12/2021), Disp: , Rfl:    triamcinolone cream (KENALOG) 0.1 %, Apply 1 application topically 2 (two) times daily. (Patient not taking: Reported on 02/12/2021), Disp: 453 g, Rfl: 0   No Known Allergies  Past Medical History:  Diagnosis Date   Asthma    Chlamydia 02/12/2021     History reviewed. No pertinent surgical history.  Family History  Problem Relation Age of Onset   Hypertension Mother    Hyperlipidemia Mother     Social History   Tobacco Use   Smoking status: Every Day   Smokeless tobacco: Never  Vaping Use   Vaping Use: Never used  Substance Use Topics   Alcohol use: Not Currently   Drug use: Not Currently    ROS   Objective:   Vitals: BP 123/76 (BP Location: Right Arm)   Pulse 79   Temp 97.6 F (36.4 C) (Oral)   Resp 18   LMP 04/28/2021   SpO2 97%   Physical Exam Constitutional:      General: She is not in acute distress.    Appearance: Normal appearance. She is well-developed. She is not ill-appearing, toxic-appearing or diaphoretic.  HENT:     Head: Normocephalic and atraumatic.     Nose: Nose normal.     Mouth/Throat:     Mouth: Mucous  membranes are moist.     Pharynx: Oropharynx is clear.  Eyes:     General: No scleral icterus.       Right eye: No discharge.        Left eye: No discharge.     Extraocular Movements: Extraocular movements intact.     Conjunctiva/sclera: Conjunctivae normal.     Pupils: Pupils are equal, round, and reactive to light.  Cardiovascular:     Rate and Rhythm: Normal rate.  Pulmonary:     Effort: Pulmonary effort is normal.  Musculoskeletal:     Cervical back: Spasms (over trapezius), torticollis and tenderness (over area outlined) present. No swelling, edema, deformity, erythema, signs of trauma, lacerations, rigidity, bony tenderness or crepitus. Pain with movement present. Decreased range of motion.       Back:     Comments: No tenderness along the cervical paraspinal muscles.  Skin:    General: Skin is warm and dry.  Neurological:     General: No focal deficit present.     Mental Status: She is alert and oriented to person, place, and time.     Motor: No weakness.     Coordination: Coordination normal.     Gait: Gait normal.     Deep Tendon Reflexes: Reflexes normal.  Psychiatric:  Mood and Affect: Mood normal.        Behavior: Behavior normal.        Thought Content: Thought content normal.        Judgment: Judgment normal.      Assessment and Plan :   PDMP not reviewed this encounter.  1. Torticollis   2. Trapezius muscle spasm     Recommended conservative management with naproxen, tizanidine, consistent hydration.  Provided her with a note for work. Counseled patient on potential for adverse effects with medications prescribed/recommended today, ER and return-to-clinic precautions discussed, patient verbalized understanding.    Wallis Bamberg, PA-C 05/19/21 1309

## 2021-05-30 ENCOUNTER — Ambulatory Visit
Admission: EM | Admit: 2021-05-30 | Discharge: 2021-05-30 | Disposition: A | Discharge status: Home / Self Care | Payer: BC Managed Care – PPO

## 2021-06-06 ENCOUNTER — Ambulatory Visit (INDEPENDENT_AMBULATORY_CARE_PROVIDER_SITE_OTHER): Payer: BC Managed Care – PPO

## 2021-06-06 ENCOUNTER — Other Ambulatory Visit (HOSPITAL_COMMUNITY)
Admission: RE | Admit: 2021-06-06 | Discharge: 2021-06-06 | Disposition: A | Discharge status: Home / Self Care | Payer: BC Managed Care – PPO | Source: Ambulatory Visit | Attending: Obstetrics and Gynecology | Admitting: Obstetrics and Gynecology

## 2021-06-06 ENCOUNTER — Other Ambulatory Visit: Payer: Self-pay

## 2021-06-06 DIAGNOSIS — Z113 Encounter for screening for infections with a predominantly sexual mode of transmission: Secondary | ICD-10-CM | POA: Insufficient documentation

## 2021-06-06 NOTE — Progress Notes (Signed)
SUBJECTIVE:  20 y.o. female who desires a STI screen. Denies abnormal vaginal discharge, bleeding or significant pelvic pain. No UTI symptoms. Denies history of known exposure to STD.  No LMP recorded. (Menstrual status: Irregular Periods).  OBJECTIVE:  She appears well.   ASSESSMENT:  STI Screen   PLAN:  Pt offered STI blood screening-Declined GC, chlamydia, and trichomonas probe sent to lab.  Treatment: To be determined once lab results are received.  Pt follow up as needed.

## 2021-06-07 LAB — CERVICOVAGINAL ANCILLARY ONLY
Chlamydia: NEGATIVE
Comment: NEGATIVE
Comment: NEGATIVE
Comment: NORMAL
Neisseria Gonorrhea: NEGATIVE
Trichomonas: NEGATIVE

## 2021-06-12 NOTE — Progress Notes (Signed)
Patient was assessed and managed by nursing staff during this encounter. I have reviewed the chart and agree with the documentation and plan. I have also made any necessary editorial changes.  Venancio Chenier, MD 06/12/2021 9:22 AM   

## 2021-07-08 IMAGING — CR DG CHEST 2V
2 series · 2 of 2 positions shown · non-contrast
Comparison: November 14, 2017

CLINICAL DATA: Chest pain

EXAM:
CHEST - 2 VIEW

[chest pa]
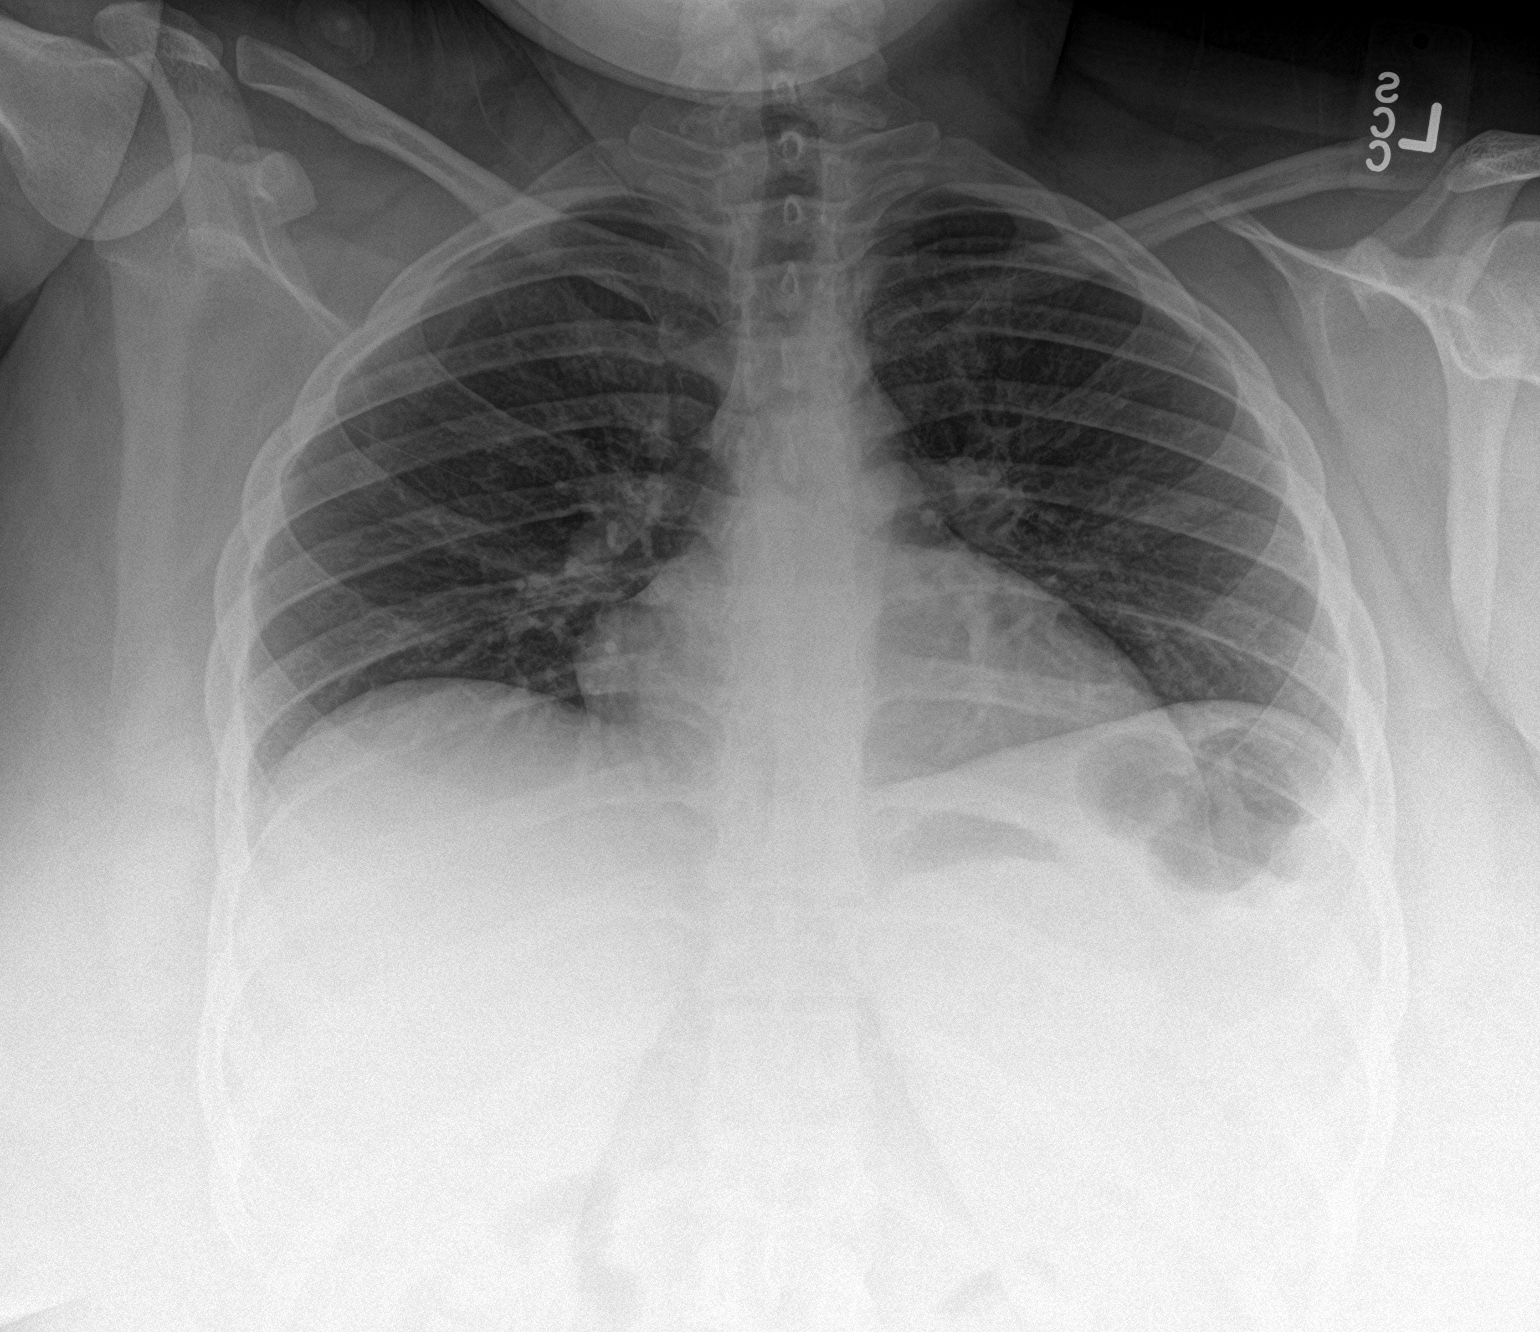

[chest lat]
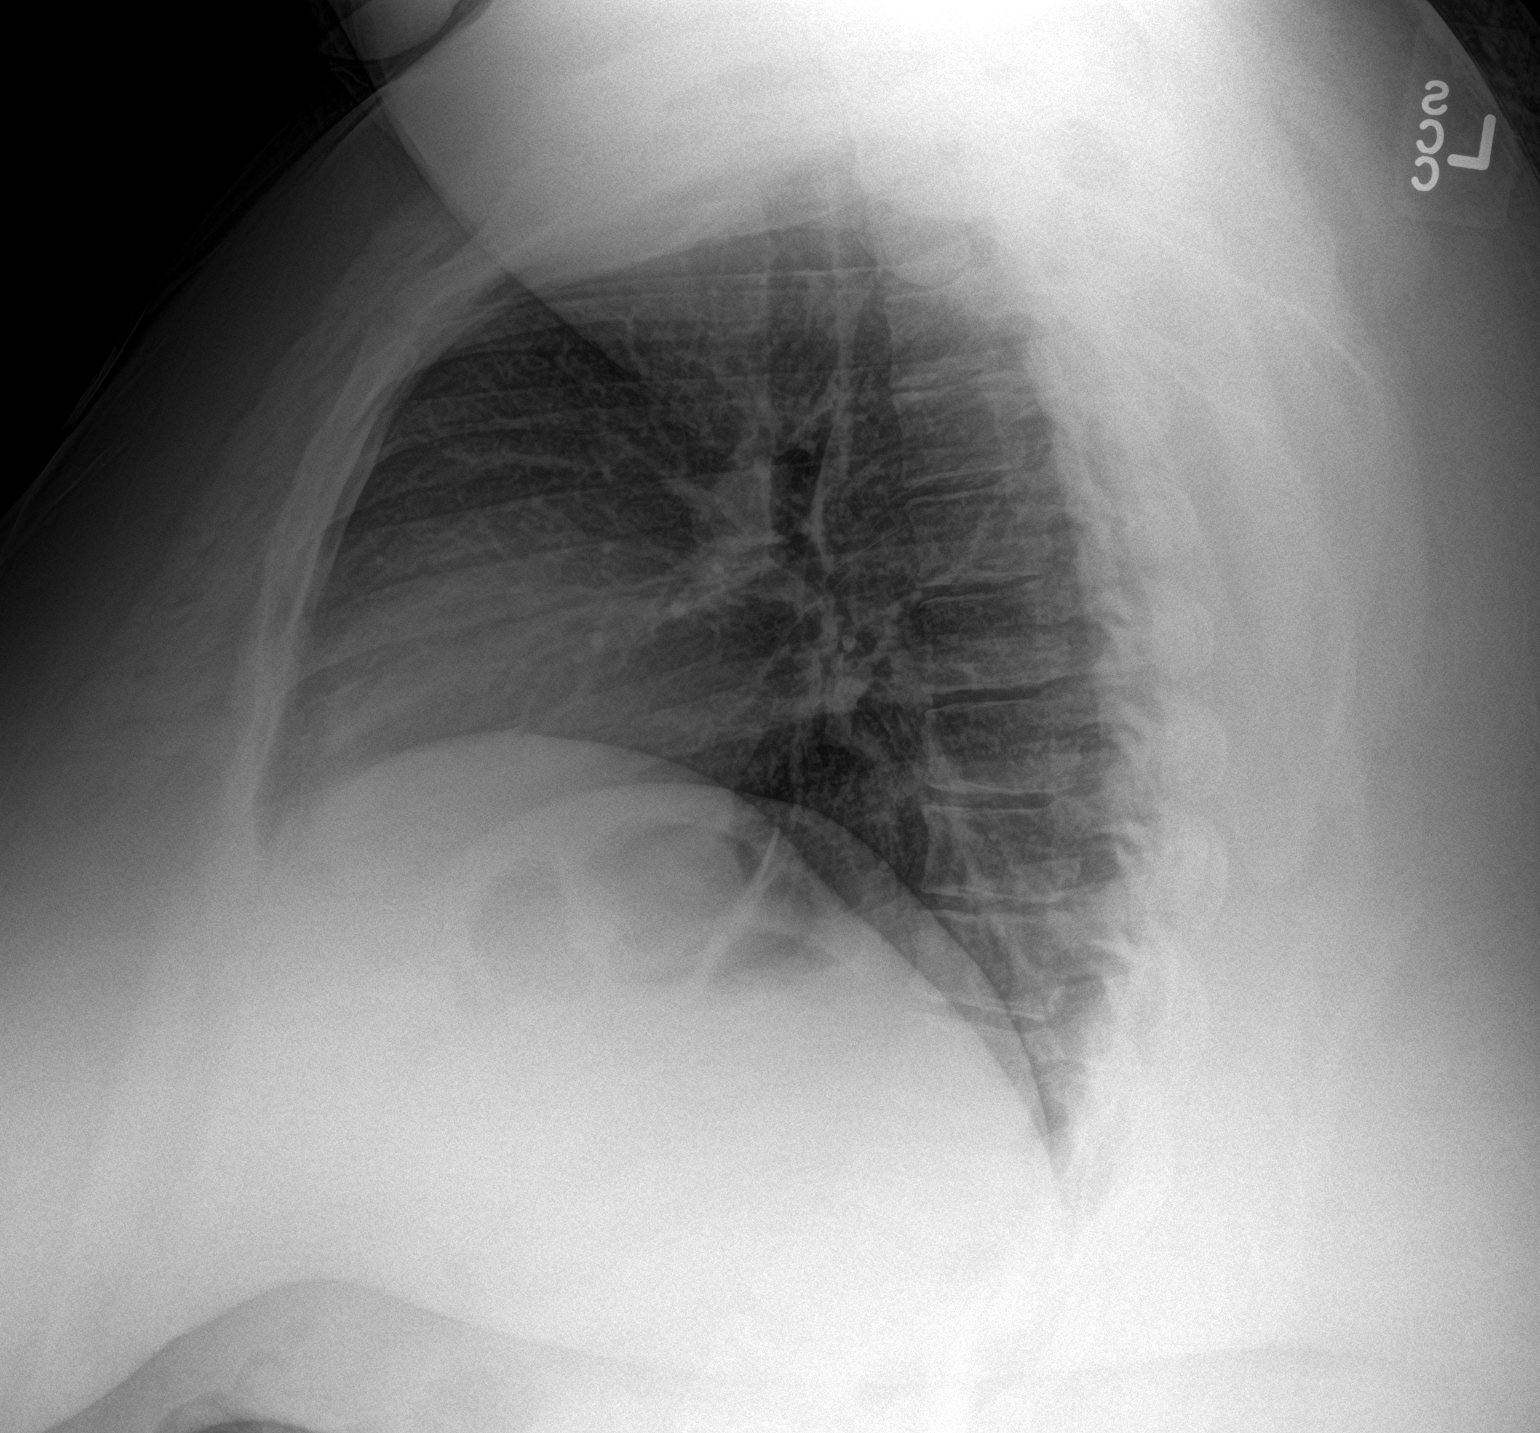

[2 of 2 positions shown; findings below may reference images not displayed]

FINDINGS: The lung volumes are low. The heart size is enlarged. There is no
pneumothorax or large pleural effusion. No definite focal
infiltrate.
IMPRESSION: Low volume chest x-ray with cardiomegaly.

## 2021-07-29 ENCOUNTER — Emergency Department (HOSPITAL_COMMUNITY)
Admission: EM | Admit: 2021-07-29 | Discharge: 2021-07-29 | Disposition: A | Discharge status: Home / Self Care | Payer: Self-pay | Attending: Emergency Medicine | Admitting: Emergency Medicine

## 2021-07-29 ENCOUNTER — Other Ambulatory Visit: Payer: Self-pay

## 2021-07-29 ENCOUNTER — Encounter: Payer: Self-pay | Admitting: Emergency Medicine

## 2021-07-29 ENCOUNTER — Encounter (HOSPITAL_COMMUNITY): Payer: Self-pay | Admitting: *Deleted

## 2021-07-29 ENCOUNTER — Ambulatory Visit
Admission: EM | Admit: 2021-07-29 | Discharge: 2021-07-29 | Disposition: A | Discharge status: Home / Self Care | Payer: BC Managed Care – PPO | Attending: Internal Medicine | Admitting: Internal Medicine

## 2021-07-29 DIAGNOSIS — J029 Acute pharyngitis, unspecified: Secondary | ICD-10-CM | POA: Insufficient documentation

## 2021-07-29 DIAGNOSIS — J069 Acute upper respiratory infection, unspecified: Secondary | ICD-10-CM | POA: Insufficient documentation

## 2021-07-29 DIAGNOSIS — Z20822 Contact with and (suspected) exposure to covid-19: Secondary | ICD-10-CM | POA: Insufficient documentation

## 2021-07-29 DIAGNOSIS — F17200 Nicotine dependence, unspecified, uncomplicated: Secondary | ICD-10-CM | POA: Insufficient documentation

## 2021-07-29 DIAGNOSIS — J45909 Unspecified asthma, uncomplicated: Secondary | ICD-10-CM | POA: Insufficient documentation

## 2021-07-29 LAB — POCT RAPID STREP A (OFFICE): Rapid Strep A Screen: NEGATIVE

## 2021-07-29 MED ORDER — PENICILLIN G BENZATHINE 1200000 UNIT/2ML IM SUSY
1.2000 10*6.[IU] | PREFILLED_SYRINGE | Freq: Once | INTRAMUSCULAR | Status: AC
  Administered 2021-07-29: 1.2 10*6.[IU] via INTRAMUSCULAR
  Filled 2021-07-29: qty 2

## 2021-07-29 MED ORDER — AMOXICILLIN 875 MG PO TABS: 875.0000 mg | ORAL_TABLET | Freq: Two times a day (BID) | ORAL | 0 refills | Status: AC

## 2021-07-29 NOTE — ED Provider Notes (Signed)
[name]    CSN: [id number] Arrival date & time: 07/29/21  0900      History   Chief Complaint Chief Complaint  Patient presents with   Sore Throat    HPI Katherine Escobar is a 20 y.o. female.   Patient presents with sore throat, cough, nasal congestion, chills that have been present for approximately 3 to 4 days.  Denies any known fevers or sick contacts.  Denies any chest pain or shortness of breath.  Patient reports that she has not taken any over-the-counter medications to help alleviate symptoms just yet.   Sore Throat   Past Medical History:  Diagnosis Date   Asthma    Chlamydia 02/12/2021    Patient Active Problem List   Diagnosis Date Noted   Chlamydia 02/12/2021    History reviewed. No pertinent surgical history.  OB History     Gravida  0   Para  0   Term  0   Preterm  0   AB  0   Living  0      SAB  0   IAB  0   Ectopic  0   Multiple  0   Live Births  0            Home Medications    Prior to Admission medications   Medication Sig Start Date End Date Taking? Authorizing Provider  amoxicillin (AMOXIL) 875 MG tablet Take 1 tablet (875 mg total) by mouth 2 (two) times daily for 10 days. 07/29/21 08/08/21 Yes Lance Muss, FNP  Cetirizine HCl 10 MG CAPS Take 1 capsule (10 mg total) by mouth at bedtime. 08/23/18   Elvina Sidle, MD  naproxen (NAPROSYN) 500 MG tablet Take 1 tablet (500 mg total) by mouth 2 (two) times daily with a meal. 05/19/21   Wallis Bamberg, PA-C  tiZANidine (ZANAFLEX) 4 MG tablet Take 1 tablet (4 mg total) by mouth every 6 (six) hours as needed for muscle spasms. 05/19/21   Wallis Bamberg, PA-C    Family History Family History  Problem Relation Age of Onset   Hypertension Mother    Hyperlipidemia Mother     Social History Social History   Tobacco Use   Smoking status: Every Day   Smokeless tobacco: Never  Vaping Use   Vaping Use: Never used  Substance Use Topics   Alcohol use: Not  Currently   Drug use: Not Currently     Allergies   Patient has no known allergies.   Review of Systems Review of Systems Per HPI  Physical Exam Triage Vital Signs ED Triage Vitals  Enc Vitals Group     BP 07/29/21 0939 129/80     Pulse Rate 07/29/21 0939 95     Resp 07/29/21 0939 16     Temp 07/29/21 0939 98.3 F (36.8 C)     Temp Source 07/29/21 0939 Oral     SpO2 07/29/21 0939 98 %     Weight --      Height --      Head Circumference --      Peak Flow --      Pain Score 07/29/21 0940 8     Pain Loc --      Pain Edu? --      Excl. in GC? --    No data found.  Updated Vital Signs BP 129/80 (BP Location: Left Arm)   Pulse 95   Temp 98.3 F (36.8 C) (Oral)  Resp 16   SpO2 98%   Visual Acuity Right Eye Distance:   Left Eye Distance:   Bilateral Distance:    Right Eye Near:   Left Eye Near:    Bilateral Near:     Physical Exam Constitutional:      General: She is not in acute distress.    Appearance: Normal appearance.  HENT:     Head: Normocephalic and atraumatic.     Right Ear: Tympanic membrane and ear canal normal.     Left Ear: Tympanic membrane and ear canal normal.     Nose: Congestion present.     Mouth/Throat:     Mouth: Mucous membranes are moist.     Pharynx: Posterior oropharyngeal erythema present. No pharyngeal swelling or uvula swelling.     Tonsils: Tonsillar exudate present. No tonsillar abscesses. 2+ on the right. 2+ on the left.  Eyes:     Extraocular Movements: Extraocular movements intact.     Conjunctiva/sclera: Conjunctivae normal.     Pupils: Pupils are equal, round, and reactive to light.  Cardiovascular:     Rate and Rhythm: Normal rate and regular rhythm.     Pulses: Normal pulses.     Heart sounds: Normal heart sounds.  Pulmonary:     Effort: Pulmonary effort is normal. No respiratory distress.     Breath sounds: Normal breath sounds. No wheezing.  Abdominal:     General: Abdomen is flat. Bowel sounds are normal.      Palpations: Abdomen is soft.  Musculoskeletal:        General: Normal range of motion.     Cervical back: Normal range of motion.  Skin:    General: Skin is warm and dry.  Neurological:     General: No focal deficit present.     Mental Status: She is alert and oriented to person, place, and time. Mental status is at baseline.  Psychiatric:        Mood and Affect: Mood normal.        Behavior: Behavior normal.     UC Treatments / Results  Labs (all labs ordered are listed, but only abnormal results are displayed) Labs Reviewed  CULTURE, GROUP A STREP ([facility])  NOVEL CORONAVIRUS, NAA  POCT RAPID STREP A (OFFICE)    EKG   Radiology No results found.  Procedures Procedures (including critical care time)  Medications Ordered in UC Medications - No data to display  Initial Impression / Assessment and Plan / UC Course  I have reviewed the triage vital signs and the nursing notes.  Pertinent labs & imaging results that were available during my care of the patient were reviewed by me and considered in my medical decision making (see chart for details).     Rapid strep test was negative, but still suspicious of strep throat given appearance of posterior pharynx and tonsils on exam.  Will treat with amoxicillin x10 days.  Discussed over-the-counter medications to help alleviate symptoms with patient as well.  Throat culture and COVID-19 viral swab are pending.  No signs of peritonsillar abscess on exam.Discussed strict return precautions. Patient verbalized understanding and is agreeable with plan.  Final Clinical Impressions(s) / UC Diagnoses   Final diagnoses:  Sore throat  Acute upper respiratory infection  Encounter for laboratory testing for COVID-19 virus     Discharge Instructions      Your rapid strep test was negative, but still suspicious of strep throat given the appearance of your throat on exam.  We will treat this with amoxicillin antibiotic.  Please pick  this up from the pharmacy.  You may also take medication such as Mucinex DM, Zyrtec, or Sudafed to help alleviate your other symptoms.  Cepacol throat lozenges are also good to help alleviate throat pain.     ED Prescriptions     Medication Sig Dispense Auth. Provider   amoxicillin (AMOXIL) 875 MG tablet Take 1 tablet (875 mg total) by mouth 2 (two) times daily for 10 days. 20 tablet Lance Muss, FNP      PDMP not reviewed this encounter.   Lance Muss, FNP 07/29/21 1026

## 2021-07-29 NOTE — Discharge Instructions (Signed)
Please read and follow all provided instructions.  Your diagnoses today include:  1. Pharyngitis, unspecified etiology     Tests performed today include: Vital signs. See below for your results today.   Medications prescribed:  You were given a one-time shot of penicillin to treat your throat infection.   Take any medications prescribed only as directed.   Home care instructions:  Please read the educational materials provided and follow any instructions contained in this packet.  Follow-up instructions: Please follow-up with your primary care provider as needed for further evaluation of your symptoms.  Return instructions:  Please return to the Emergency Department if you experience worsening symptoms.  Return if you have worsening problems swallowing, your neck becomes swollen, you cannot swallow your saliva or your voice becomes muffled.  Return with high persistent fever, persistent vomiting, or if you have trouble breathing.  Please return if you have any other emergent concerns.  Additional Information:  Your vital signs today were: BP 139/76 (BP Location: Right Arm)   Pulse 97   Temp 99 F (37.2 C) (Oral)   Resp 16   LMP 07/28/2021   SpO2 100%  If your blood pressure (BP) was elevated above 135/85 this visit, please have this repeated by your doctor within one month. --------------

## 2021-07-29 NOTE — ED Triage Notes (Signed)
Pt c/o sore throat since Friday. Pt was seen at UC this morning and prescribed pills that she cannot take

## 2021-07-29 NOTE — ED Provider Notes (Signed)
[facility] EMERGENCY DEPARTMENT Provider Note   CSN: [id number] Arrival date & time: 07/29/21  2013     History Chief Complaint  Patient presents with   Sore Throat    Katherine Escobar is a 20 y.o. female.  Patient presents emergency department for evaluation of sore throat and nasal congestion that she has had over the past 3 to 4 days.  Sore throat has progressed to the point where she cannot swallow solid foods and medications.  She went to urgent care this morning and had a strep test which was negative and a COVID test which has not resulted.  No documented fevers.  No cough or shortness of breath.  She states that she cannot take the amoxicillin tablets prescribed earlier today.  Symptoms have not changed appreciably since her visit this morning.  She cannot eat dinner tonight because of throat pain.  Onset of symptoms acute.  Course is worsening.  Nothing make symptoms better.  Swallowing makes the pain worse.      Past Medical History:  Diagnosis Date   Asthma    Chlamydia 02/12/2021    Patient Active Problem List   Diagnosis Date Noted   Chlamydia 02/12/2021    History reviewed. No pertinent surgical history.   OB History     Gravida  0   Para  0   Term  0   Preterm  0   AB  0   Living  0      SAB  0   IAB  0   Ectopic  0   Multiple  0   Live Births  0           Family History  Problem Relation Age of Onset   Hypertension Mother    Hyperlipidemia Mother     Social History   Tobacco Use   Smoking status: Every Day   Smokeless tobacco: Never  Vaping Use   Vaping Use: Never used  Substance Use Topics   Alcohol use: Not Currently   Drug use: Not Currently    Home Medications Prior to Admission medications   Medication Sig Start Date End Date Taking? Authorizing Provider  amoxicillin (AMOXIL) 875 MG tablet Take 1 tablet (875 mg total) by mouth 2 (two) times daily for 10 days. 07/29/21 08/08/21  Lance Muss,  FNP  Cetirizine HCl 10 MG CAPS Take 1 capsule (10 mg total) by mouth at bedtime. 08/23/18   Elvina Sidle, MD  naproxen (NAPROSYN) 500 MG tablet Take 1 tablet (500 mg total) by mouth 2 (two) times daily with a meal. 05/19/21   Wallis Bamberg, PA-C  tiZANidine (ZANAFLEX) 4 MG tablet Take 1 tablet (4 mg total) by mouth every 6 (six) hours as needed for muscle spasms. 05/19/21   Wallis Bamberg, PA-C    Allergies    Patient has no known allergies.  Review of Systems   Review of Systems  Constitutional:  Negative for chills, fatigue and fever.  HENT:  Positive for congestion and sore throat. Negative for ear pain, rhinorrhea and sinus pressure.   Eyes:  Negative for redness.  Respiratory:  Negative for cough and wheezing.   Gastrointestinal:  Negative for abdominal pain, diarrhea, nausea and vomiting.  Genitourinary:  Negative for dysuria.  Musculoskeletal:  Negative for myalgias and neck stiffness.  Skin:  Negative for rash.  Neurological:  Negative for headaches.  Hematological:  Negative for adenopathy.   Physical Exam Updated Vital Signs BP 139/76 (BP Location:  Right Arm)   Pulse 97   Temp 99 F (37.2 C) (Oral)   Resp 16   LMP 07/28/2021   SpO2 100%   Physical Exam Vitals and nursing note reviewed.  Constitutional:      Appearance: She is well-developed.  HENT:     Head: Normocephalic and atraumatic.     Jaw: No trismus.     Right Ear: Tympanic membrane, ear canal and external ear normal.     Left Ear: Tympanic membrane, ear canal and external ear normal.     Nose: Nose normal. No mucosal edema or rhinorrhea.     Mouth/Throat:     Mouth: Mucous membranes are moist. Mucous membranes are not dry. No oral lesions.     Pharynx: Uvula midline. Oropharyngeal exudate and posterior oropharyngeal erythema present. No uvula swelling.     Tonsils: Tonsillar exudate present. No tonsillar abscesses. 3+ on the right. 3+ on the left.  Eyes:     General:        Right eye: No discharge.         Left eye: No discharge.     Conjunctiva/sclera: Conjunctivae normal.  Cardiovascular:     Rate and Rhythm: Normal rate and regular rhythm.     Heart sounds: Normal heart sounds.  Pulmonary:     Effort: Pulmonary effort is normal. No respiratory distress.     Breath sounds: Normal breath sounds. No wheezing or rales.  Abdominal:     Palpations: Abdomen is soft.     Tenderness: There is no abdominal tenderness.  Musculoskeletal:     Cervical back: Normal range of motion and neck supple.  Lymphadenopathy:     Cervical: No cervical adenopathy.  Skin:    General: Skin is warm and dry.  Neurological:     Mental Status: She is alert.  Psychiatric:        Mood and Affect: Mood normal.    ED Results / Procedures / Treatments   Labs (all labs ordered are listed, but only abnormal results are displayed) Labs Reviewed - No data to display  EKG None  Radiology No results found.  Procedures Procedures   Medications Ordered in ED Medications  penicillin g benzathine (BICILLIN LA) 1200000 UNIT/2ML injection 1.2 Million Units (has no administration in time range)    ED Course  I have reviewed the triage vital signs and the nursing notes.  Pertinent labs & imaging results that were available during my care of the patient were reviewed by me and considered in my medical decision making (see chart for details).  Patient seen and examined.  Reviewed work-up from urgent care earlier today.  As there was concern for pharyngitis, patient offered IM Bicillin.  She accepts.  Recommended children's Tylenol or ibuprofen for pain control, soft foods, warm tea.  Stressed importance of staying hydrated.  Encouraged return with worsening pain, high fevers, other concerns.  Vital signs reviewed and are as follows: BP 139/76 (BP Location: Right Arm)   Pulse 97   Temp 99 F (37.2 C) (Oral)   Resp 16   LMP 07/28/2021   SpO2 100%      MDM Rules/Calculators/A&P                            Patient with tonsillar swelling and exudates consistent with tonsillitis.  She is not able to tolerate her medications prescribed earlier today, prompting visit.  She was offered and administered IM  Bicillin.  No signs of peritonsillar abscess.  No signs of respiratory compromise or deep space tissue infection in neck.  Patient appears well, nontoxic.   Final Clinical Impression(s) / ED Diagnoses Final diagnoses:  Pharyngitis, unspecified etiology    Rx / DC Orders ED Discharge Orders     None        Renne Crigler, Cordelia Poche 07/29/21 2049    Milagros Loll, MD 07/29/21 2219

## 2021-07-29 NOTE — ED Triage Notes (Signed)
Cough, sore throat, chills, runny nose, nasal congestion since friday

## 2021-07-29 NOTE — Discharge Instructions (Addendum)
Your rapid strep test was negative, but still suspicious of strep throat given the appearance of your throat on exam.  We will treat this with amoxicillin antibiotic.  Please pick this up from the pharmacy.  You may also take medication such as Mucinex DM, Zyrtec, or Sudafed to help alleviate your other symptoms.  Cepacol throat lozenges are also good to help alleviate throat pain.

## 2021-07-30 LAB — NOVEL CORONAVIRUS, NAA: SARS-CoV-2, NAA: NOT DETECTED

## 2021-07-30 LAB — SARS-COV-2, NAA 2 DAY TAT

## 2021-07-31 LAB — CULTURE, GROUP A STREP (THRC)

## 2021-08-13 ENCOUNTER — Other Ambulatory Visit: Payer: Self-pay

## 2021-08-13 ENCOUNTER — Telehealth: Payer: BC Managed Care – PPO | Admitting: Obstetrics & Gynecology

## 2021-08-19 ENCOUNTER — Encounter: Payer: Self-pay | Admitting: Emergency Medicine

## 2021-08-19 ENCOUNTER — Ambulatory Visit
Admission: EM | Admit: 2021-08-19 | Discharge: 2021-08-19 | Disposition: A | Discharge status: Home / Self Care | Payer: Self-pay | Attending: Physician Assistant | Admitting: Physician Assistant

## 2021-08-19 ENCOUNTER — Other Ambulatory Visit: Payer: Self-pay

## 2021-08-19 DIAGNOSIS — J069 Acute upper respiratory infection, unspecified: Secondary | ICD-10-CM

## 2021-08-19 NOTE — ED Triage Notes (Signed)
Patient c/o non-productive, sneezing, runny nose, congestion.  Patient exposed to RSV.  Patient is not vaccinated COVID.

## 2021-08-19 NOTE — ED Provider Notes (Signed)
[name]    CSN: [id number] Arrival date & time: 08/19/21  4097      History   Chief Complaint Chief Complaint  Patient presents with   Exposed to RSV    HPI Katherine Escobar is a 20 y.o. female.   Patient here today for evaluation of few day history of sneezing, congestion and cough. 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 She has not had fever above 99.70F. She denies any vomiting or diarrhea. She has tried OTC meds without resolution.     Past Medical History:  Diagnosis Date   Asthma    Chlamydia 02/12/2021    Patient Active Problem List   Diagnosis Date Noted   Chlamydia 02/12/2021    History reviewed. No pertinent surgical history.  OB History     Gravida  0   Para  0   Term  0   Preterm  0   AB  0   Living  0      SAB  0   IAB  0   Ectopic  0   Multiple  0   Live Births  0            Home Medications    Prior to Admission medications   Medication Sig Start Date End Date Taking? Authorizing Provider  Cetirizine HCl 10 MG CAPS Take 1 capsule (10 mg total) by mouth at bedtime. 08/23/18   Elvina Sidle, MD  naproxen (NAPROSYN) 500 MG tablet Take 1 tablet (500 mg total) by mouth 2 (two) times daily with a meal. 05/19/21   Wallis Bamberg, PA-C  tiZANidine (ZANAFLEX) 4 MG tablet Take 1 tablet (4 mg total) by mouth every 6 (six) hours as needed for muscle spasms. 05/19/21   Wallis Bamberg, PA-C    Family History Family History  Problem Relation Age of Onset   Hypertension Mother    Hyperlipidemia Mother     Social History Social History   Tobacco Use   Smoking status: Every Day   Smokeless tobacco: Never  Vaping Use   Vaping Use: Never used  Substance Use Topics   Alcohol use: Not Currently   Drug use: Not Currently     Allergies   Patient has no known allergies.   Review of Systems Review of Systems  Constitutional:  Negative  for chills and fever.  HENT:  Positive for congestion, sinus pressure and sore throat. Negative for ear pain.   Eyes:  Negative for discharge and redness.  Respiratory:  Positive for cough. Negative for shortness of breath and wheezing.   Gastrointestinal:  Negative for abdominal pain, diarrhea, nausea and vomiting.    Physical Exam Triage Vital Signs ED Triage Vitals  Enc Vitals Group     BP      Pulse      Resp      Temp      Temp src      SpO2      Weight      Height      Head Circumference      Peak Flow      Pain Score      Pain Loc      Pain Edu?      Excl. in GC?    No data found.  Updated Vital Signs BP (!) 143/77 (BP Location: Left Arm)   Pulse 85  Temp 98.7 F (37.1 C) (Oral)   Ht 5\' 3"  (1.6 m)   Wt 260 lb (117.9 kg)   LMP 07/28/2021   SpO2 98%   BMI 46.06 kg/m   Physical Exam Vitals and nursing note reviewed.  Constitutional:      General: She is not in acute distress.    Appearance: Normal appearance. She is not ill-appearing.  HENT:     Head: Normocephalic and atraumatic.     Right Ear: Tympanic membrane normal.     Left Ear: Tympanic membrane normal.     Nose: Congestion present.     Mouth/Throat:     Mouth: Mucous membranes are moist.     Pharynx: No oropharyngeal exudate or posterior oropharyngeal erythema.  Eyes:     Conjunctiva/sclera: Conjunctivae normal.  Cardiovascular:     Rate and Rhythm: Normal rate and regular rhythm.     Heart sounds: Normal heart sounds. No murmur heard. Pulmonary:     Effort: Pulmonary effort is normal. No respiratory distress.     Breath sounds: Normal breath sounds. No wheezing, rhonchi or rales.  Skin:    General: Skin is warm and dry.  Neurological:     Mental Status: She is alert.  Psychiatric:        Mood and Affect: Mood normal.        Thought Content: Thought content normal.     UC Treatments / Results  Labs (all labs ordered are listed, but only abnormal results are displayed) Labs  Reviewed  COVID-19, FLU A+B AND RSV    EKG   Radiology No results found.  Procedures Procedures (including critical care time)  Medications Ordered in UC Medications - No data to display  Initial Impression / Assessment and Plan / UC Course  I have reviewed the triage vital signs and the nursing notes.  Pertinent labs & imaging results that were available during my care of the patient were reviewed by me and considered in my medical decision making (see chart for details).    Suspect viral etiology of symptoms vs allergies-- will screen for covid, flu and rsv. Will await results for further recommendation. Encouraged follow up with any further concerns.   Final Clinical Impressions(s) / UC Diagnoses   Final diagnoses:  Acute upper respiratory infection   Discharge Instructions   None    ED Prescriptions   None    PDMP not reviewed this encounter.   09/27/2021, PA-C 08/19/21 1056

## 2021-08-20 LAB — COVID-19, FLU A+B AND RSV
Influenza A, NAA: NOT DETECTED
Influenza B, NAA: NOT DETECTED
RSV, NAA: DETECTED — AB
SARS-CoV-2, NAA: NOT DETECTED

## 2021-09-07 ENCOUNTER — Other Ambulatory Visit: Payer: Self-pay

## 2021-09-07 ENCOUNTER — Ambulatory Visit (HOSPITAL_COMMUNITY)
Admission: EM | Admit: 2021-09-07 | Discharge: 2021-09-07 | Disposition: A | Discharge status: Home / Self Care | Payer: Self-pay | Attending: Physician Assistant | Admitting: Physician Assistant

## 2021-09-07 ENCOUNTER — Encounter (HOSPITAL_COMMUNITY): Payer: Self-pay

## 2021-09-07 DIAGNOSIS — J069 Acute upper respiratory infection, unspecified: Secondary | ICD-10-CM

## 2021-09-07 LAB — POC INFLUENZA A AND B ANTIGEN (URGENT CARE ONLY)
INFLUENZA A ANTIGEN, POC: NEGATIVE
INFLUENZA B ANTIGEN, POC: NEGATIVE

## 2021-09-07 NOTE — ED Triage Notes (Signed)
Pt present runny nose with coughing and congestion.  Symptom started last night. Pt recently tested positive with RSV.

## 2021-09-07 NOTE — ED Provider Notes (Signed)
[facility]    CSN: [id number] Arrival date & time: 09/07/21  1552      History   Chief Complaint Chief Complaint  Patient presents with   Cough   Nasal Congestion    HPI Katherine Escobar is a 20 y.o. female.   Patient here c/w fever x 1 day.  Admits nasal congestion, rhinorrhea, sore throat, cough.  Recovered from RSV 1 week ago, now sick again.  No other sick contacts.   Past Medical History:  Diagnosis Date   Asthma    Chlamydia 02/12/2021    Patient Active Problem List   Diagnosis Date Noted   Chlamydia 02/12/2021    History reviewed. No pertinent surgical history.  OB History     Gravida  0   Para  0   Term  0   Preterm  0   AB  0   Living  0      SAB  0   IAB  0   Ectopic  0   Multiple  0   Live Births  0            Home Medications    Prior to Admission medications   Medication Sig Start Date End Date Taking? Authorizing Provider  Cetirizine HCl 10 MG CAPS Take 1 capsule (10 mg total) by mouth at bedtime. 08/23/18   Elvina Sidle, MD  naproxen (NAPROSYN) 500 MG tablet Take 1 tablet (500 mg total) by mouth 2 (two) times daily with a meal. 05/19/21   Wallis Bamberg, PA-C  tiZANidine (ZANAFLEX) 4 MG tablet Take 1 tablet (4 mg total) by mouth every 6 (six) hours as needed for muscle spasms. 05/19/21   Wallis Bamberg, PA-C    Family History Family History  Problem Relation Age of Onset   Hypertension Mother    Hyperlipidemia Mother     Social History Social History   Tobacco Use   Smoking status: Every Day   Smokeless tobacco: Never  Vaping Use   Vaping Use: Never used  Substance Use Topics   Alcohol use: Not Currently   Drug use: Not Currently     Allergies   Patient has no known allergies.   Review of Systems Review of Systems  Constitutional:  Positive for chills, fatigue and fever.  HENT:  Positive for congestion and rhinorrhea. Negative for ear pain, nosebleeds, postnasal drip, sinus pressure, sinus  pain and sore throat.   Eyes:  Negative for pain and redness.  Respiratory:  Positive for cough. Negative for shortness of breath and wheezing.   Gastrointestinal:  Negative for abdominal pain, diarrhea, nausea and vomiting.  Musculoskeletal:  Positive for arthralgias and myalgias.  Skin:  Negative for rash.  Neurological:  Negative for light-headedness and headaches.  Hematological:  Negative for adenopathy. Does not bruise/bleed easily.  Psychiatric/Behavioral:  Negative for confusion and sleep disturbance.     Physical Exam Triage Vital Signs ED Triage Vitals  Enc Vitals Group     BP 09/07/21 1706 (!) 110/96     Pulse Rate 09/07/21 1706 (!) 115     Resp 09/07/21 1706 18     Temp 09/07/21 1706 99.3 F (37.4 C)     Temp Source 09/07/21 1706 Oral     SpO2 09/07/21 1706 100 %     Weight --      Height --      Head Circumference --      Peak Flow --      Pain Score  09/07/21 1708 0     Pain Loc --      Pain Edu? --      Excl. in GC? --    No data found.  Updated Vital Signs BP (!) 110/96 (BP Location: Left Arm)   Pulse (!) 115   Temp 99.3 F (37.4 C) (Oral)   Resp 18   SpO2 100%   Visual Acuity Right Eye Distance:   Left Eye Distance:   Bilateral Distance:    Right Eye Near:   Left Eye Near:    Bilateral Near:     Physical Exam Vitals and nursing note reviewed.  Constitutional:      General: She is not in acute distress.    Appearance: Normal appearance. She is not ill-appearing.  HENT:     Head: Normocephalic and atraumatic.     Right Ear: Tympanic membrane and ear canal normal.     Left Ear: Tympanic membrane and ear canal normal.     Nose: Congestion and rhinorrhea present. No mucosal edema. Rhinorrhea is clear.     Mouth/Throat:     Pharynx: Uvula midline. Posterior oropharyngeal erythema present. No pharyngeal swelling, oropharyngeal exudate or uvula swelling.     Tonsils: No tonsillar exudate or tonsillar abscesses.  Eyes:     General: No scleral  icterus.    Extraocular Movements: Extraocular movements intact.     Conjunctiva/sclera: Conjunctivae normal.     Pupils: Pupils are equal, round, and reactive to light.  Cardiovascular:     Rate and Rhythm: Regular rhythm. Tachycardia present.     Heart sounds: No murmur heard. Pulmonary:     Effort: Pulmonary effort is normal. No respiratory distress.     Breath sounds: Normal breath sounds. No wheezing or rales.  Musculoskeletal:     Cervical back: Normal range of motion. No rigidity.  Skin:    Capillary Refill: Capillary refill takes less than 2 seconds.     Coloration: Skin is not jaundiced.     Findings: No rash.  Neurological:     General: No focal deficit present.     Mental Status: She is alert and oriented to person, place, and time.     Motor: No weakness.     Gait: Gait normal.  Psychiatric:        Mood and Affect: Mood normal.        Behavior: Behavior normal.     UC Treatments / Results  Labs (all labs ordered are listed, but only abnormal results are displayed) Labs Reviewed  POC INFLUENZA A AND B ANTIGEN (URGENT CARE ONLY)    EKG   Radiology No results found.  Procedures Procedures (including critical care time)  Medications Ordered in UC Medications - No data to display  Initial Impression / Assessment and Plan / UC Course  I have reviewed the triage vital signs and the nursing notes.  Pertinent labs & imaging results that were available during my care of the patient were reviewed by me and considered in my medical decision making (see chart for details).     Declined COVID test, recommend take home COVID test Final Clinical Impressions(s) / UC Diagnoses   Final diagnoses:  Viral upper respiratory tract infection     Discharge Instructions      Recommend take COVID test at home.     ED Prescriptions   None    PDMP not reviewed this encounter.   Evern Core, PA-C 09/07/21 1800

## 2021-09-07 NOTE — Discharge Instructions (Addendum)
Recommend take COVID test at home.

## 2021-09-08 ENCOUNTER — Other Ambulatory Visit: Payer: Self-pay

## 2021-09-08 ENCOUNTER — Ambulatory Visit
Admission: EM | Admit: 2021-09-08 | Discharge: 2021-09-08 | Disposition: A | Discharge status: Home / Self Care | Payer: Self-pay | Attending: Physician Assistant | Admitting: Physician Assistant

## 2021-09-08 DIAGNOSIS — J101 Influenza due to other identified influenza virus with other respiratory manifestations: Secondary | ICD-10-CM

## 2021-09-08 LAB — POCT INFLUENZA A/B
Influenza A, POC: POSITIVE — AB
Influenza B, POC: NEGATIVE

## 2021-09-08 MED ORDER — OSELTAMIVIR PHOSPHATE 75 MG PO CAPS: 75.0000 mg | ORAL_CAPSULE | Freq: Two times a day (BID) | ORAL | 0 refills | Status: DC

## 2021-09-08 NOTE — ED Triage Notes (Signed)
Pt seen yesterday for cough and congestion. Negative flu test and no meds prescribed per Pt. Pt presents today complaining of worsening cough and onset this morning of emesis. Emesis x2-3. Has been taking tylenol w/o relief.  Notes testing positive for RSV recently.

## 2021-09-08 NOTE — ED Provider Notes (Signed)
Katherine Escobar    CSN: [id number] Arrival date & time: 09/08/21  0954      History   Chief Complaint Chief Complaint  Patient presents with   Cough   Emesis    HPI Katherine Escobar is a 20 y.o. female.   Here today for evaluation of nasal congestion, cough, fever and vomiting that started 2 days ago.  She reports that she was here yesterday and had negative flu test at that time.  Vomiting just started this morning however.  She has not had a diarrhea.  She has tried over-the-counter treatment without significant relief.  The history is provided by the patient.  Cough Associated symptoms: fever and sore throat (with cough)   Associated symptoms: no ear pain, no eye discharge, no shortness of breath and no wheezing   Emesis Associated symptoms: cough, fever and sore throat (with cough)   Associated symptoms: no abdominal pain and no diarrhea    Past Medical History:  Diagnosis Date   Asthma    Chlamydia 02/12/2021    Patient Active Problem List   Diagnosis Date Noted   Chlamydia 02/12/2021    History reviewed. No pertinent surgical history.  OB History     Gravida  0   Para  0   Term  0   Preterm  0   AB  0   Living  0      SAB  0   IAB  0   Ectopic  0   Multiple  0   Live Births  0            Home Medications    Prior to Admission medications   Medication Sig Start Date End Date Taking? Authorizing Provider  oseltamivir (TAMIFLU) 75 MG capsule Take 1 capsule (75 mg total) by mouth every 12 (twelve) hours. 09/08/21  Yes Tomi Bamberger, PA-C  Cetirizine HCl 10 MG CAPS Take 1 capsule (10 mg total) by mouth at bedtime. 08/23/18   Elvina Sidle, MD  naproxen (NAPROSYN) 500 MG tablet Take 1 tablet (500 mg total) by mouth 2 (two) times daily with a meal. 05/19/21   Wallis Bamberg, PA-C  tiZANidine (ZANAFLEX) 4 MG tablet Take 1 tablet (4 mg total) by mouth every 6 (six) hours as needed for muscle spasms. 05/19/21   Wallis Bamberg, PA-C     Family History Family History  Problem Relation Age of Onset   Hypertension Mother    Hyperlipidemia Mother     Social History Social History   Tobacco Use   Smoking status: Every Day   Smokeless tobacco: Never  Vaping Use   Vaping Use: Never used  Substance Use Topics   Alcohol use: Not Currently   Drug use: Not Currently     Allergies   Patient has no known allergies.   Review of Systems Review of Systems  Constitutional:  Positive for fever.  HENT:  Positive for congestion, sinus pressure and sore throat (with cough). Negative for ear pain.   Eyes:  Negative for discharge and redness.  Respiratory:  Positive for cough. Negative for shortness of breath and wheezing.   Gastrointestinal:  Positive for nausea and vomiting. Negative for abdominal pain and diarrhea.    Physical Exam Triage Vital Signs ED Triage Vitals  Enc Vitals Group     BP 09/08/21 1122 130/84     Pulse Rate 09/08/21 1122 (!) 102     Resp 09/08/21 1122 18     Temp 09/08/21  1122 99.2 F (37.3 C)     Temp Source 09/08/21 1122 Oral     SpO2 09/08/21 1122 97 %     Weight --      Height --      Head Circumference --      Peak Flow --      Pain Score 09/08/21 1125 8     Pain Loc --      Pain Edu? --      Excl. in GC? --    No data found.  Updated Vital Signs BP 130/84 (BP Location: Left Arm)   Pulse (!) 102   Temp 99.2 F (37.3 C) (Oral)   Resp 18   LMP 07/30/2021 (Exact Date)   SpO2 97%       Physical Exam Vitals and nursing note reviewed.  Constitutional:      General: She is not in acute distress.    Appearance: Normal appearance. She is not ill-appearing.  HENT:     Head: Normocephalic and atraumatic.     Nose: Congestion present.     Mouth/Throat:     Mouth: Mucous membranes are moist.     Pharynx: No oropharyngeal exudate or posterior oropharyngeal erythema.  Eyes:     Conjunctiva/sclera: Conjunctivae normal.  Cardiovascular:     Rate and Rhythm: Normal rate and  regular rhythm.     Heart sounds: Normal heart sounds. No murmur heard. Pulmonary:     Effort: Pulmonary effort is normal. No respiratory distress.     Breath sounds: Normal breath sounds. No wheezing, rhonchi or rales.  Skin:    General: Skin is warm and dry.  Neurological:     Mental Status: She is alert.  Psychiatric:        Mood and Affect: Mood normal.        Thought Content: Thought content normal.     UC Treatments / Results  Labs (all labs ordered are listed, but only abnormal results are displayed) Labs Reviewed  POCT INFLUENZA A/B - Abnormal; Notable for the following components:      Result Value   Influenza A, POC Positive (*)    All other components within normal limits    EKG   Radiology No results found.  Procedures Procedures (including critical Escobar time)  Medications Ordered in UC Medications - No data to display  Initial Impression / Assessment and Plan / UC Course  I have reviewed the triage vital signs and the nursing notes.  Pertinent labs & imaging results that were available during my Escobar of the patient were reviewed by me and considered in my medical decision making (see chart for details).    Repeat Flu test positive today. Tamiflu prescribed. Recommend symptomatic treatment and follow up if symptoms fail to improve or worsen in any way.   Final Clinical Impressions(s) / UC Diagnoses   Final diagnoses:  Influenza A   Discharge Instructions   None    ED Prescriptions     Medication Sig Dispense Auth. Provider   oseltamivir (TAMIFLU) 75 MG capsule Take 1 capsule (75 mg total) by mouth every 12 (twelve) hours. 10 capsule Tomi Bamberger, PA-C      PDMP not reviewed this encounter.   Tomi Bamberger, PA-C 09/08/21 1213

## 2021-09-24 ENCOUNTER — Other Ambulatory Visit: Payer: Self-pay

## 2021-09-24 ENCOUNTER — Ambulatory Visit: Admission: EM | Admit: 2021-09-24 | Discharge: 2021-09-24 | Discharge status: Against Medical Advice | Payer: Self-pay

## 2021-11-26 ENCOUNTER — Encounter: Payer: Self-pay | Admitting: Obstetrics & Gynecology

## 2021-11-26 ENCOUNTER — Ambulatory Visit (INDEPENDENT_AMBULATORY_CARE_PROVIDER_SITE_OTHER): Payer: Self-pay | Admitting: Obstetrics & Gynecology

## 2021-11-26 ENCOUNTER — Other Ambulatory Visit: Payer: Self-pay

## 2021-11-26 ENCOUNTER — Other Ambulatory Visit (HOSPITAL_COMMUNITY)
Admission: RE | Admit: 2021-11-26 | Discharge: 2021-11-26 | Disposition: A | Discharge status: Home / Self Care | Payer: Self-pay | Source: Ambulatory Visit | Attending: Obstetrics & Gynecology | Admitting: Obstetrics & Gynecology

## 2021-11-26 VITALS — BP 120/77 | HR 84 | Ht 63.0 in | Wt 276.0 lb

## 2021-11-26 DIAGNOSIS — Z113 Encounter for screening for infections with a predominantly sexual mode of transmission: Secondary | ICD-10-CM | POA: Insufficient documentation

## 2021-11-26 NOTE — Progress Notes (Signed)
° °  SUBJECTIVE:  21 y.o. female denies vaginal discharge. Denies abnormal vaginal bleeding or significant pelvic pain or fever. No UTI symptoms. Denies history of known exposure to STD.  Patient's last menstrual period was 09/16/2021 (exact date).  OBJECTIVE:  She appears well, afebrile. Urine dipstick: not done.  ASSESSMENT:  STD screen per patient request   PLAN:  GC, chlamydia, trichomonas, probe sent to lab. Treatment: To be determined once lab results are received  Derl Barrow, RN   Attending Addendum Patient was assessed and managed by nursing staff during this encounter. I have reviewed the chart and agree with the documentation and plan. I have also made any necessary editorial changes.  Of note, this was scheduled initially as a MD visit but switched to RN visit due to insurance restrictions.    Will follow up self swab results and manage accordingly. Patient referred to [facility] for pap smear.  Verita Schneiders, MD 11/26/2021 1:37 PM

## 2021-11-27 LAB — CERVICOVAGINAL ANCILLARY ONLY
Chlamydia: NEGATIVE
Comment: NEGATIVE
Comment: NEGATIVE
Comment: NORMAL
Neisseria Gonorrhea: NEGATIVE
Trichomonas: NEGATIVE

## 2021-11-28 ENCOUNTER — Telehealth: Payer: Self-pay

## 2021-11-29 ENCOUNTER — Encounter (HOSPITAL_COMMUNITY): Payer: Self-pay | Admitting: Emergency Medicine

## 2021-11-29 ENCOUNTER — Other Ambulatory Visit: Payer: Self-pay

## 2021-11-29 ENCOUNTER — Emergency Department (HOSPITAL_COMMUNITY)
Admission: EM | Admit: 2021-11-29 | Discharge: 2021-11-30 | Disposition: A | Discharge status: Home / Self Care | Payer: Self-pay | Attending: Emergency Medicine | Admitting: Emergency Medicine

## 2021-11-29 DIAGNOSIS — Z5321 Procedure and treatment not carried out due to patient leaving prior to being seen by health care provider: Secondary | ICD-10-CM | POA: Insufficient documentation

## 2021-11-29 DIAGNOSIS — R1110 Vomiting, unspecified: Secondary | ICD-10-CM | POA: Insufficient documentation

## 2021-11-29 DIAGNOSIS — Z20822 Contact with and (suspected) exposure to covid-19: Secondary | ICD-10-CM | POA: Insufficient documentation

## 2021-11-29 DIAGNOSIS — R197 Diarrhea, unspecified: Secondary | ICD-10-CM | POA: Insufficient documentation

## 2021-11-29 DIAGNOSIS — R509 Fever, unspecified: Secondary | ICD-10-CM | POA: Insufficient documentation

## 2021-11-29 LAB — RESP PANEL BY RT-PCR (FLU A&B, COVID) ARPGX2
Influenza A by PCR: NEGATIVE
Influenza B by PCR: NEGATIVE
SARS Coronavirus 2 by RT PCR: NEGATIVE

## 2021-11-29 MED ORDER — ACETAMINOPHEN 325 MG PO TABS
650.0000 mg | ORAL_TABLET | Freq: Once | ORAL | Status: AC
  Administered 2021-11-29: 650 mg via ORAL
  Filled 2021-11-29: qty 2

## 2021-11-29 NOTE — ED Provider Triage Note (Addendum)
Emergency Medicine Provider Triage Evaluation Note  Katherine Escobar , a 21 y.o. female  was evaluated in triage.  Pt complains of fever onset today.  She had a max temp of 102.  She did not take any medications for her symptoms.  She has associated nonbloody emesis, nonbloody diarrhea, chills.  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 Denies nausea, chest pain, shortness of breath.  Review of Systems  Positive: As per HPI above. Negative: As per HPI above.  Physical Exam  BP (!) 106/57    Pulse (!) 134    Temp (!) 102.2 F (39 C) (Oral)    Resp 16    Ht 5\' 3"  (1.6 m)    Wt 135 kg    LMP 11/08/2021    SpO2 100%    BMI 52.72 kg/m  Gen:   Awake, no distress  Resp:  Normal effort  MSK:   Moves extremities without difficulty  Other:  Able to ambulate without assistance or difficulty.  No chest wall tenderness to palpation or abdominal tenderness to palpation.  Medical Decision Making  Medically screening exam initiated at 9:36 PM.  Appropriate orders placed.  Jaiyla Tyrell was informed that the remainder of the evaluation will be completed by another provider, this initial triage assessment does not replace that evaluation, and the importance of remaining in the ED until their evaluation is complete.    Lariah Fleer A, PA-C 11/29/21 2144    Brannon Decaire A, PA-C 11/29/21 2151

## 2021-11-29 NOTE — ED Triage Notes (Signed)
Patient reports fever with chills , diarrhea and emesis onset today .

## 2022-03-16 ENCOUNTER — Other Ambulatory Visit: Payer: Self-pay

## 2022-03-16 ENCOUNTER — Emergency Department (HOSPITAL_COMMUNITY)
Admission: EM | Admit: 2022-03-16 | Discharge: 2022-03-16 | Disposition: A | Discharge status: Home / Self Care | Payer: Self-pay | Attending: Emergency Medicine | Admitting: Emergency Medicine

## 2022-03-16 ENCOUNTER — Encounter (HOSPITAL_COMMUNITY): Payer: Self-pay | Admitting: Emergency Medicine

## 2022-03-16 DIAGNOSIS — J020 Streptococcal pharyngitis: Secondary | ICD-10-CM | POA: Insufficient documentation

## 2022-03-16 LAB — GROUP A STREP BY PCR: Group A Strep by PCR: DETECTED — AB

## 2022-03-16 MED ORDER — ACETAMINOPHEN 325 MG PO TABS
650.0000 mg | ORAL_TABLET | Freq: Once | ORAL | Status: AC
  Administered 2022-03-16: 650 mg via ORAL
  Filled 2022-03-16: qty 2

## 2022-03-16 MED ORDER — PENICILLIN G BENZATHINE 1200000 UNIT/2ML IM SUSY
1.2000 10*6.[IU] | PREFILLED_SYRINGE | Freq: Once | INTRAMUSCULAR | Status: AC
  Administered 2022-03-16: 1.2 10*6.[IU] via INTRAMUSCULAR
  Filled 2022-03-16: qty 2

## 2022-03-16 NOTE — ED Triage Notes (Signed)
Patient reports sore throat with mild headache and fever onset this morning .

## 2022-03-16 NOTE — ED Provider Notes (Signed)
[facility] EM[facility]ENCY [facility] Provider Note   CSN: [id number] Arrival date & time: 03/16/22  1900     History  Chief Complaint  Patient presents with   Sore Throat    Katherine Escobar is a 21 y.o. female for evaluation of sore throat and fever.  Symptoms onset this morning.  Has noted a mild aching headache.  No neck pain, neck stiffness.  No meds PTA.  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  No vision changes, numbness, weakness, chest pain, shortness of breath, cough, congestion, rhinorrhea.  Has been tolerating p.o. intake at home without difficulty. No facial swelling, change in voice, drooling, sensation of throat closing.  Actively eating a food tray on my initial assessment  HPI     Home Medications Prior to Admission medications   Not on File      Allergies    Patient has no known allergies.    Review of Systems   Review of Systems  Constitutional:  Positive for fever.  HENT:  Positive for sore throat. Negative for congestion, dental problem, drooling, ear discharge, postnasal drip, rhinorrhea, sinus pressure, sneezing, trouble swallowing and voice change.   Respiratory: Negative.    Cardiovascular: Negative.   Gastrointestinal: Negative.   Genitourinary: Negative.   Musculoskeletal: Negative.   Skin: Negative.   Neurological: Negative.   All other systems reviewed and are negative.  Physical Exam Updated Vital Signs BP 112/72   Pulse (!) 103   Temp 99.7 F (37.6 C) (Oral)   Resp 15   LMP 02/19/2022   SpO2 100%  Physical Exam Vitals and nursing note reviewed.  Constitutional:      General: She is not in acute distress.    Appearance: She is well-developed. She is obese. She is not ill-appearing, toxic-appearing or diaphoretic.     Comments: Eating a sandwich on my initial evaluation  HENT:     Head: Normocephalic and atraumatic.      Jaw: There is normal jaw occlusion.     Comments: No facial swelling, no drooling, dysphagia or trismus.  No Ludwig's angina    Nose: Nose normal. No congestion or rhinorrhea.     Mouth/Throat:     Lips: Pink.     Mouth: Mucous membranes are moist.     Pharynx: Oropharynx is clear. Uvula midline. No uvula swelling.     Tonsils: Tonsillar exudate present. No tonsillar abscesses. 2+ on the right. 2+ on the left.     Comments: Uvula midline.  Tonsils 2+ bilaterally with erythema and exudate. No  PTA or RPA.  No pooling of secretions.  No sublingual edema, induration. Eyes:     Pupils: Pupils are equal, round, and reactive to light.  Neck:     Trachea: Trachea and phonation normal.     Comments: No neck stiffness neck rigidity.  No meningismus Cardiovascular:     Rate and Rhythm: Normal rate.     Pulses: Normal pulses.          Radial pulses are 2+ on the right side and 2+ on the left side.     Heart sounds: Normal heart sounds.  Pulmonary:     Effort: Pulmonary effort is normal. No respiratory distress.     Breath sounds: Normal breath sounds and air entry.  Abdominal:     General: Bowel sounds are normal. There is no distension.  Palpations: Abdomen is soft.  Musculoskeletal:        General: Normal range of motion.     Cervical back: Full passive range of motion without pain and normal range of motion.  Lymphadenopathy:     Cervical: Cervical adenopathy present.  Skin:    General: Skin is warm and dry.  Neurological:     General: No focal deficit present.     Mental Status: She is alert.  Psychiatric:        Mood and Affect: Mood normal.   ED Results / Procedures / Treatments   Labs (all labs ordered are listed, but only abnormal results are displayed) Labs Reviewed  GROUP A STREP BY PCR - Abnormal; Notable for the following components:      Result Value   Group A Strep by PCR DETECTED (*)    All other components within normal limits    EKG None  Radiology No  results found.  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  Has a mild aching headache.  No head trauma.  She is nonfocal neuro exam without deficits.  She has no neck stiffness or neck rigidity.  I have low suspicion for meningitis.  Was febrile on arrival, given APAP, defervesced.  She does not appear septic.  She has 2+ tonsils bilaterally erythema and exudate.  Her uvula is midline.  She is no evidence of PTA or RPA.  I have low suspicion for deep space infection, Ludwig's angina, airway compromise.  Actually when I entered her room for initial evaluation she was actively eating a sandwich and tolerating without any difficulty.  Labs personally viewed and interpreted:  Strep pharyngitis positive  Discussed results with patient, family in room.  Agreeable for IM Bicillin.  Encouraged Tylenol, anti-inflammatory as needed for pain.  Also encourage fluids, rest.  Provided with work note.  She will return for new or worsening symptoms.  I did encourage follow-up with ENT as patient reports she has had multiple episodes of strep pharyngitis over the last year  Based off of patient's history, exam, vital signs, lab work obtained in the emergency department I do not feel she needs additional labs, imaging or hospitalization at this time  The patient has been appropriately medically screened and/or stabilized in the ED. I have low suspicion for any other emergent medical condition which would require further screening, evaluation or  treatment in the ED or require inpatient management.  Patient is hemodynamically stable and in no acute distress.  Patient able to ambulate in department prior to ED.  Evaluation does not show acute pathology that would require ongoing or additional emergent interventions while in the emergency department or further inpatient treatment.  I have discussed the diagnosis with the patient and answered all questions.  Pain is been managed while in the emergency department and patient has no further complaints prior to discharge.  Patient is comfortable with plan discussed in room and is stable for discharge at this time.  I have discussed strict return precautions for returning to the emergency  department.  Patient was encouraged to follow-up with PCP/specialist refer to at discharge.                            Medical Decision Making Amount and/or Complexity of Data Reviewed Independent Historian: parent External Data Reviewed: labs, radiology and notes. Labs: ordered. Decision-making details documented in ED Course.  Risk OTC drugs. Prescription drug management. Decision regarding hospitalization. Diagnosis or treatment significantly limited by social determinants of health.         Final Clinical Impression(s) / ED Diagnoses Final diagnoses:  Strep pharyngitis    Rx / DC Orders ED Discharge Orders     None         Solita Macadam A, PA-C 03/16/22 2043    Charlynne Pander, MD 03/16/22 2325

## 2022-03-16 NOTE — Discharge Instructions (Signed)
It was a pleasure taking care of you here in the emergency department today  Your strep test was positive this is likely the cause of your sore throat and fever.  May take Tylenol Motrin as needed for pain.  I provided you with a work note.  Return for new or worsening symptoms

## 2022-03-16 NOTE — ED Notes (Signed)
E-signature pad unavailable at time of pt discharge. This RN discussed discharge materials with pt and answered all pt questions. Pt stated understanding of discharge material. ? ?

## 2022-03-17 ENCOUNTER — Other Ambulatory Visit: Payer: Self-pay

## 2022-03-17 ENCOUNTER — Emergency Department (HOSPITAL_COMMUNITY)
Admission: EM | Admit: 2022-03-17 | Discharge: 2022-03-18 | Disposition: A | Discharge status: Home / Self Care | Payer: Self-pay | Attending: Emergency Medicine | Admitting: Emergency Medicine

## 2022-03-17 ENCOUNTER — Encounter (HOSPITAL_COMMUNITY): Payer: Self-pay | Admitting: Emergency Medicine

## 2022-03-17 DIAGNOSIS — J020 Streptococcal pharyngitis: Secondary | ICD-10-CM | POA: Insufficient documentation

## 2022-03-17 MED ORDER — DEXAMETHASONE SODIUM PHOSPHATE 10 MG/ML IJ SOLN
10.0000 mg | Freq: Once | INTRAMUSCULAR | Status: AC
  Administered 2022-03-17: 10 mg via INTRAMUSCULAR
  Filled 2022-03-17: qty 1

## 2022-03-17 MED ORDER — LIDOCAINE VISCOUS HCL 2 % MT SOLN: 15.0000 mL | OROMUCOSAL | 0 refills | Status: DC | PRN

## 2022-03-17 NOTE — ED Triage Notes (Signed)
Patient coming from home, complaining of worsening sore throat. Patient was seen here and diagnosed with strep throat. States pain is worse and feels difficult to swallow.

## 2022-03-17 NOTE — Discharge Instructions (Addendum)
The antibiotics may take another 24 hours to see improvement. The steroid shot I have given you today will help with swelling and discomfort. Continue Tylenol and swish and spit the lidocaine to help with throat discomfort. Return to the ER if you start to experience worsening pain after 24 hours, trouble opening your mouth, drooling, neck stiffness or fever.

## 2022-03-17 NOTE — ED Notes (Signed)
Patient discharge instructions reviewed with the patient. The patient verbalized understanding of instructions. Patient discharged. 

## 2022-03-17 NOTE — ED Provider Notes (Signed)
[facility] EMERGENCY DEPARTMENT Provider Note   CSN: [id number] Arrival date & time: 03/17/22  1419     History  Chief Complaint  Patient presents with   Sore Throat    Katherine Escobar is a 21 y.o. female presenting to the ED with continued sore throat.  Patient has been having sore throat since yesterday.  She was seen and evaluated in the ER yesterday, diagnosed with strep throat and was given IM penicillin.  She has been taking Tylenol but continues to have pain.  Denies any trismus, drooling, shortness of breath, neck stiffness or fever   Sore Throat Pertinent negatives include no shortness of breath.      Home Medications Prior to Admission medications   Medication Sig Start Date End Date Taking? Authorizing Provider  lidocaine (XYLOCAINE) 2 % solution Use as directed 15 mLs in the mouth or throat as needed for mouth pain. 03/17/22  Yes Karem Farha, PA-C      Allergies    Patient has no known allergies.    Review of Systems   Review of Systems  Constitutional:  Negative for fever.  HENT:  Positive for sore throat. Negative for sinus pressure.   Respiratory:  Negative for shortness of breath.   Musculoskeletal:  Negative for neck pain.   Physical Exam Updated Vital Signs BP 124/63 (BP Location: Right Arm)   Pulse 100   Temp 98.8 F (37.1 C)   Resp 18   LMP 02/19/2022   SpO2 100%  Physical Exam Vitals and nursing note reviewed.  Constitutional:      General: She is not in acute distress.    Appearance: She is well-developed. She is not diaphoretic.     Comments: Speaking complete sentences without difficulty.  HENT:     Head: Normocephalic and atraumatic.     Mouth/Throat:     Pharynx: Uvula midline. Posterior oropharyngeal erythema present.     Tonsils: 1+ on the right. 1+ on the left.     Comments: Bilaterally, symmetrically enlarged tonsils without exudates.  Uvula is midline Patient does not appear to be in acute distress. No  trismus or drooling present. No pooling of secretions. Patient is tolerating secretions and is not in respiratory distress. No neck pain or tenderness to palpation of the neck. Full active and passive range of motion of the neck. No evidence of RPA or PTA. Eyes:     General: No scleral icterus.    Conjunctiva/sclera: Conjunctivae normal.  Pulmonary:     Effort: Pulmonary effort is normal. No respiratory distress.  Musculoskeletal:     Cervical back: Normal range of motion.  Skin:    Findings: No rash.  Neurological:     Mental Status: She is alert.    ED Results / Procedures / Treatments   Labs (all labs ordered are listed, but only abnormal results are displayed) Labs Reviewed - No data to display  EKG None  Radiology No results found.  Procedures Procedures    Medications Ordered in ED Medications  dexamethasone (DECADRON) injection 10 mg (has no administration in time range)    ED Course/ Medical Decision Making/ A&P                           Medical Decision Making Risk Prescription drug management.   21 year old female presenting to the ED for continued sore throat.  Seen and evaluated less than 24 hours ago and diagnosed with strep  throat.  She was treated with IM penicillin.  She continues to be symptomatic.  Exam of the posterior oropharynx revealed bilaterally, symmetrically enlarged tonsils without exudates.  She has no trismus or drooling noted on exam.  She is in no acute distress.  She is resting comfortably.  She is afebrile here.  No uvula deviation noted.  I have low suspicion for RPA or PTA based on physical exam findings.  I informed her that her symptoms may take another 24 hours to improve after given antibiotics.  I have provided her with a Decadron shot to help with swelling.  Lab work continue Tylenol and will prescribe lidocaine to swish and spit to help with discomfort as well.  Patient is agreeable to the plan.  I provided her for ENT follow-up as  she is inquiring about getting her tonsils removed as she has had frequent strep infections since she was a child.  Return precautions given.   Patient is hemodynamically stable, in NAD, and able to ambulate in the ED. Evaluation does not show pathology that would require ongoing emergent intervention or inpatient treatment. I explained the diagnosis to the patient. Pain has been managed and has no complaints prior to discharge. Patient is comfortable with above plan and is stable for discharge at this time. All questions were answered prior to disposition. Strict return precautions for returning to the ED were discussed. Encouraged follow up with PCP.   An After Visit Summary was printed and given to the patient.   Portions of this note were generated with Scientist, clinical (histocompatibility and immunogenetics). Dictation errors may occur despite best attempts at proofreading.         Final Clinical Impression(s) / ED Diagnoses Final diagnoses:  Strep pharyngitis    Rx / DC Orders ED Discharge Orders          Ordered    lidocaine (XYLOCAINE) 2 % solution  As needed        03/17/22 1513              Dietrich Pates, PA-C 03/17/22 1515    Horton, Clabe Seal, DO 03/18/22 1643

## 2022-04-09 ENCOUNTER — Ambulatory Visit: Payer: Self-pay

## 2022-06-16 ENCOUNTER — Ambulatory Visit: Payer: Self-pay

## 2022-06-21 ENCOUNTER — Ambulatory Visit
Admission: EM | Admit: 2022-06-21 | Discharge: 2022-06-21 | Disposition: A | Discharge status: Home / Self Care | Payer: Self-pay | Attending: Physician Assistant | Admitting: Physician Assistant

## 2022-06-21 ENCOUNTER — Encounter: Payer: Self-pay | Admitting: Physician Assistant

## 2022-06-21 DIAGNOSIS — M654 Radial styloid tenosynovitis [de Quervain]: Secondary | ICD-10-CM

## 2022-06-21 MED ORDER — IBUPROFEN 800 MG PO TABS
800.0000 mg | ORAL_TABLET | Freq: Three times a day (TID) | ORAL | 0 refills | Status: DC
Start: 2022-06-21 — End: 2023-03-10

## 2022-06-21 NOTE — ED Triage Notes (Signed)
Pt presents to [facility] with co of L wrist pain since this morning. Pt st she woke up with the pain today not sure if she has hurt it while caring the baby carsean

## 2022-06-21 NOTE — Discharge Instructions (Signed)
Use the brace is much as possible but particularly at night.  Start ibuprofen 800 mg.  Do not take NSAIDs including aspirin, ibuprofen/Advil, naproxen/Aleve with this medication.  You can follow-up with orthopedics if symptoms or not improving.  If you have any worsening symptoms including weakness, swelling, numbness, tingling sensation in your hand you should be seen immediately.

## 2022-06-21 NOTE — ED Provider Notes (Signed)
[name]    CSN: [id number] Arrival date & time: 06/21/22  1345      History   Chief Complaint Chief Complaint  Patient presents with   Wrist Pain    HPI Katherine Escobar is a 21 y.o. female.   Patient presents today with a 1 day history of significant radial left wrist pain.  She denies any known injury or increase in activity prior to symptom onset.  Reports that she woke up with the pain that has been worsening since onset.  Pain is currently rated 8 on a 0-10 pain scale but increases to 10 with certain movements.  She does report that she is lifting her infant son and caring his car seat and wonders if this could have triggered symptoms.  She has not been taking any over-the-counter medication for symptom management.  She is right-handed.  Denies any numbness or paresthesias.  She is not breast-feeding.  She is confident she is not pregnant.    Past Medical History:  Diagnosis Date   Asthma    Chlamydia 02/12/2021    Patient Active Problem List   Diagnosis Date Noted   Chlamydia 02/12/2021    History reviewed. No pertinent surgical history.  OB History     Gravida  0   Para  0   Term  0   Preterm  0   AB  0   Living  0      SAB  0   IAB  0   Ectopic  0   Multiple  0   Live Births  0            Home Medications    Prior to Admission medications   Medication Sig Start Date End Date Taking? Authorizing Provider  ibuprofen (ADVIL) 800 MG tablet Take 1 tablet (800 mg total) by mouth 3 (three) times daily. 06/21/22  Yes Lutie Pickler K, PA-C  lidocaine (XYLOCAINE) 2 % solution Use as directed 15 mLs in the mouth or throat as needed for mouth pain. 03/17/22   Dietrich Pates, PA-C    Family History Family History  Problem Relation Age of Onset   Hypertension Mother    Hyperlipidemia Mother     Social History Social History   Tobacco Use   Smoking status: Every Day    Types: Cigars    Passive exposure: Never   Smokeless  tobacco: Never  Vaping Use   Vaping Use: Never used  Substance Use Topics   Alcohol use: Not Currently    Comment: 08/2021   Drug use: Not Currently    Types: Marijuana    Comment: 2020     Allergies   Patient has no known allergies.   Review of Systems Review of Systems  Constitutional:  Positive for activity change. Negative for appetite change, fatigue and fever.  Gastrointestinal:  Negative for abdominal pain, diarrhea, nausea and vomiting.  Musculoskeletal:  Positive for arthralgias. Negative for joint swelling and myalgias.  Neurological:  Negative for dizziness, weakness, light-headedness, numbness and headaches.     Physical Exam Triage Vital Signs ED Triage Vitals [06/21/22 1417]  Enc Vitals Group     BP (!) 154/69     Pulse Rate 92     Resp 18     Temp 98.2 F (36.8 C)     Temp src      SpO2 95 %     Weight      Height  Head Circumference      Peak Flow      Pain Score      Pain Loc      Pain Edu?      Excl. in GC?    No data found.  Updated Vital Signs BP (!) 154/69   Pulse 92   Temp 98.2 F (36.8 C)   Resp 18   SpO2 95%   Visual Acuity Right Eye Distance:   Left Eye Distance:   Bilateral Distance:    Right Eye Near:   Left Eye Near:    Bilateral Near:     Physical Exam Vitals reviewed.  Constitutional:      General: She is awake. She is not in acute distress.    Appearance: Normal appearance. She is well-developed. She is not ill-appearing.     Comments: Very pleasant female appears stated age in no acute distress sitting comfortably in exam room  HENT:     Head: Normocephalic and atraumatic.  Cardiovascular:     Rate and Rhythm: Normal rate and regular rhythm.     Heart sounds: Normal heart sounds, S1 normal and S2 normal. No murmur heard. Pulmonary:     Effort: Pulmonary effort is normal.     Breath sounds: Normal breath sounds. No wheezing, rhonchi or rales.     Comments: Clear to auscultation  bilaterally Musculoskeletal:     Left wrist: Tenderness present. No swelling, bony tenderness or snuff box tenderness. Decreased range of motion.     Comments: Tenderness palpation over radial left wrist.  No deformity or point bony tenderness noted.  Hand neurovascularly intact.  Negative Tinel and Phalen's.  Positive Finkelstein.  Psychiatric:        Behavior: Behavior is cooperative.      UC Treatments / Results  Labs (all labs ordered are listed, but only abnormal results are displayed) Labs Reviewed - No data to display  EKG   Radiology No results found.  Procedures Procedures (including critical care time)  Medications Ordered in UC Medications - No data to display  Initial Impression / Assessment and Plan / UC Course  I have reviewed the triage vital signs and the nursing notes.  Pertinent labs & imaging results that were available during my care of the patient were reviewed by me and considered in my medical decision making (see chart for details).     Symptoms consistent with de Quervain's tenosynovitis.  Patient was placed in a brace for comfort and support.  Discussed that she should use this is much as possible particularly at night.  We will start high-dose ibuprofen.  She was instructed not to take additional NSAIDs with this medication.  Can use conservative treatment measures including RICE protocol for additional symptom relief.  Recommend she follow-up with orthopedics if symptoms or not improving quickly.  X-rays were deferred as she denies any recent trauma and has no point tenderness.  Discussed that if she has any worsening symptoms including increased pain, swelling, numbness, paresthesias she needs to be seen immediately.  Final Clinical Impressions(s) / UC Diagnoses   Final diagnoses:  De Quervain's tenosynovitis, left     Discharge Instructions      Use the brace is much as possible but particularly at night.  Start ibuprofen 800 mg.  Do not  take NSAIDs including aspirin, ibuprofen/Advil, naproxen/Aleve with this medication.  You can follow-up with orthopedics if symptoms or not improving.  If you have any worsening symptoms including weakness, swelling, numbness, tingling  sensation in your hand you should be seen immediately.     ED Prescriptions     Medication Sig Dispense Auth. Provider   ibuprofen (ADVIL) 800 MG tablet Take 1 tablet (800 mg total) by mouth 3 (three) times daily. 21 tablet Sanda Dejoy, Noberto Retort, PA-C      PDMP not reviewed this encounter.   Jeani Hawking, PA-C 06/21/22 1506

## 2022-07-03 ENCOUNTER — Ambulatory Visit: Admission: EM | Admit: 2022-07-03 | Discharge: 2022-07-03 | Discharge status: Absent without leave | Payer: Self-pay

## 2022-07-07 ENCOUNTER — Ambulatory Visit: Payer: Self-pay | Admitting: Family

## 2022-09-02 ENCOUNTER — Ambulatory Visit (INDEPENDENT_AMBULATORY_CARE_PROVIDER_SITE_OTHER): Payer: Self-pay

## 2022-09-02 ENCOUNTER — Other Ambulatory Visit (HOSPITAL_COMMUNITY)
Admission: RE | Admit: 2022-09-02 | Discharge: 2022-09-02 | Disposition: A | Discharge status: Home / Self Care | Payer: Self-pay | Source: Ambulatory Visit | Attending: Obstetrics & Gynecology | Admitting: Obstetrics & Gynecology

## 2022-09-02 VITALS — BP 139/78 | HR 75 | Wt 273.0 lb

## 2022-09-02 DIAGNOSIS — Z113 Encounter for screening for infections with a predominantly sexual mode of transmission: Secondary | ICD-10-CM | POA: Insufficient documentation

## 2022-09-02 DIAGNOSIS — B9689 Other specified bacterial agents as the cause of diseases classified elsewhere: Secondary | ICD-10-CM

## 2022-09-02 DIAGNOSIS — N912 Amenorrhea, unspecified: Secondary | ICD-10-CM

## 2022-09-02 LAB — POCT URINE PREGNANCY: Preg Test, Ur: NEGATIVE

## 2022-09-02 NOTE — Progress Notes (Signed)
SUBJECTIVE:  21 y.o. female who desires a STI screen. Denies abnormal vaginal discharge, bleeding or significant pelvic pain. No UTI symptoms. Denies history of known exposure to STD. Pt states cycle is late requested UPT.  No LMP recorded.  OBJECTIVE:  She appears well.   ASSESSMENT:  STI Screen  UPT   PLAN:  Pt offered STI blood screening-not indicated GC, chlamydia, and trichomonas,BV and yeast probe sent to lab.  Treatment: To be determined once lab results are received. UPT negative.  Pt follow up as needed.

## 2022-09-03 LAB — CERVICOVAGINAL ANCILLARY ONLY
Bacterial Vaginitis (gardnerella): POSITIVE — AB
Candida Glabrata: NEGATIVE
Candida Vaginitis: NEGATIVE
Chlamydia: NEGATIVE
Comment: NEGATIVE
Comment: NEGATIVE
Comment: NEGATIVE
Comment: NEGATIVE
Comment: NEGATIVE
Comment: NORMAL
Neisseria Gonorrhea: NEGATIVE
Trichomonas: NEGATIVE

## 2022-09-03 MED ORDER — METRONIDAZOLE 500 MG PO TABS: 500.0000 mg | ORAL_TABLET | Freq: Two times a day (BID) | ORAL | 0 refills | Status: AC

## 2022-09-03 NOTE — Addendum Note (Signed)
Addended by: Jaynie Collins A on: 09/03/2022 03:54 PM   Modules accepted: Orders

## 2022-10-01 ENCOUNTER — Ambulatory Visit
Admission: RE | Admit: 2022-10-01 | Discharge: 2022-10-01 | Disposition: A | Discharge status: Home / Self Care | Payer: Self-pay | Source: Ambulatory Visit | Attending: Emergency Medicine | Admitting: Emergency Medicine

## 2022-10-01 VITALS — BP 118/82 | HR 69 | Temp 98.7°F | Resp 20 | Ht 63.0 in | Wt 265.0 lb

## 2022-10-01 DIAGNOSIS — U071 COVID-19: Secondary | ICD-10-CM

## 2022-10-01 LAB — POCT INFLUENZA A/B
Influenza A, POC: NEGATIVE
Influenza B, POC: NEGATIVE

## 2022-10-01 LAB — POC SARS CORONAVIRUS 2 AG -  ED: SARS Coronavirus 2 Ag: POSITIVE — AB

## 2022-10-01 NOTE — ED Provider Notes (Signed)
[facility]    CSN: [id number] Arrival date & time: 10/01/22  1627      History   Chief Complaint Chief Complaint  Patient presents with   Cough   Headache   Chills    HPI Katherine Escobar is a 21 y.o. female.   Patient presents with concerns of flu-like symptoms for the past 3 days. She reports headache, body aches, fatigue, chills, nasal congestion, sneezing, sore throat, and cough. She has not taken anything for her symptoms. 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   The history is provided by the patient.  Cough Associated symptoms: chills, headaches, myalgias and sore throat   Associated symptoms: no ear pain, no rash and no shortness of breath   Headache Associated symptoms: congestion, cough, fatigue, myalgias and sore throat   Associated symptoms: no diarrhea, no dizziness, no ear pain, no nausea and no vomiting     Past Medical History:  Diagnosis Date   Asthma    Chlamydia 02/12/2021    Patient Active Problem List   Diagnosis Date Noted   Chlamydia 02/12/2021    History reviewed. No pertinent surgical history.  OB History     Gravida  0   Para  0   Term  0   Preterm  0   AB  0   Living  0      SAB  0   IAB  0   Ectopic  0   Multiple  0   Live Births  0            Home Medications    Prior to Admission medications   Medication Sig Start Date End Date Taking? Authorizing Provider  ibuprofen (ADVIL) 800 MG tablet Take 1 tablet (800 mg total) by mouth 3 (three) times daily. 06/21/22   Raspet, Denny Peon K, PA-C  lidocaine (XYLOCAINE) 2 % solution Use as directed 15 mLs in the mouth or throat as needed for mouth pain. 03/17/22   Dietrich Pates, PA-C    Family History Family History  Problem Relation Age of Onset   Hypertension Mother    Hyperlipidemia Mother    Arrhythmia Mother     Social History Social History   Tobacco Use   Smoking status: Every Day    Types: Cigars     Passive exposure: Never   Smokeless tobacco: Never  Vaping Use   Vaping Use: Never used  Substance Use Topics   Alcohol use: Not Currently   Drug use: Not Currently     Allergies   Patient has no known allergies.   Review of Systems Review of Systems  Constitutional:  Positive for chills and fatigue.  HENT:  Positive for congestion, sneezing and sore throat. Negative for ear pain.   Respiratory:  Positive for cough. Negative for shortness of breath.   Gastrointestinal:  Negative for diarrhea, nausea and vomiting.  Musculoskeletal:  Positive for myalgias.  Skin:  Negative for rash.  Neurological:  Positive for headaches. Negative for dizziness.     Physical Exam Triage Vital Signs ED Triage Vitals  Enc Vitals Group     BP 10/01/22 1651 118/82     Pulse Rate 10/01/22 1651 69     Resp 10/01/22 1651 20     Temp 10/01/22 1651 98.7 F (37.1 C)     Temp Source 10/01/22 1651 Oral     SpO2 10/01/22 1651 98 %     Weight 10/01/22  1647 265 lb (120.2 kg)     Height 10/01/22 1647 5\' 3"  (1.6 m)     Head Circumference --      Peak Flow --      Pain Score 10/01/22 1647 0     Pain Loc --      Pain Edu? --      Excl. in GC? --    No data found.  Updated Vital Signs BP 118/82 (BP Location: Left Arm)   Pulse 69   Temp 98.7 F (37.1 C) (Oral)   Resp 20   Ht 5\' 3"  (1.6 m)   Wt 265 lb (120.2 kg)   LMP 09/30/2022   SpO2 98%   BMI 46.94 kg/m   Visual Acuity Right Eye Distance:   Left Eye Distance:   Bilateral Distance:    Right Eye Near:   Left Eye Near:    Bilateral Near:     Physical Exam Vitals and nursing note reviewed.  Constitutional:      General: She is not in acute distress. HENT:     Head: Normocephalic.     Right Ear: Tympanic membrane, ear canal and external ear normal.     Left Ear: Tympanic membrane, ear canal and external ear normal.     Nose: Congestion present. No rhinorrhea.     Mouth/Throat:     Mouth: Mucous membranes are moist.     Pharynx:  Oropharynx is clear. No oropharyngeal exudate or posterior oropharyngeal erythema.  Eyes:     Conjunctiva/sclera: Conjunctivae normal.     Pupils: Pupils are equal, round, and reactive to light.  Cardiovascular:     Rate and Rhythm: Normal rate and regular rhythm.     Heart sounds: Normal heart sounds.  Pulmonary:     Effort: Pulmonary effort is normal.     Breath sounds: Normal breath sounds.  Lymphadenopathy:     Cervical: No cervical adenopathy.  Skin:    Findings: No rash.  Neurological:     Mental Status: She is alert.  Psychiatric:        Mood and Affect: Mood normal.      UC Treatments / Results  Labs (all labs ordered are listed, but only abnormal results are displayed) Labs Reviewed  POC SARS CORONAVIRUS 2 AG -  ED - Abnormal; Notable for the following components:      Result Value   SARS Coronavirus 2 Ag Positive (*)    All other components within normal limits  POCT INFLUENZA A/B    EKG   Radiology No results found.  Procedures Procedures (including critical care time)  Medications Ordered in UC Medications - No data to display  Initial Impression / Assessment and Plan / UC Course  I have reviewed the triage vital signs and the nursing notes.  Pertinent labs & imaging results that were available during my care of the patient were reviewed by me and considered in my medical decision making (see chart for details).     Positive COVID. Not high-risk so anti-viral not indicated. Sx tx and reassurance. Pt inquired about work note until 12/11 as her company has her on "COVID leave" until then - advised can only write three days out so she opted for a generic note just stating she was seen and tested positive.   E/M: 1 acute uncomplicated illness, no data, low risk   Final Clinical Impressions(s) / UC Diagnoses   Final diagnoses:  COVID     Discharge Instructions  COVID test positive. Take OTC medicine as needed for symptoms. Rest and keep  hydrated. Follow-up with PCP if no improvement in a week. Go to the ER if develop difficulty breathing.      ED Prescriptions   None    PDMP not reviewed this encounter.   Delsa Sale, [place] 10/01/22 1725

## 2022-10-01 NOTE — Discharge Instructions (Addendum)
COVID test positive. Take OTC medicine as needed for symptoms. Rest and keep hydrated. Follow-up with PCP if no improvement in a week. Go to the ER if develop difficulty breathing.

## 2022-10-01 NOTE — ED Triage Notes (Signed)
Pt presents to Urgent Care with c/o cough, HA and body aches, nasal congestion, and chills x 2-3 days. Has not done COVID test.

## 2022-10-02 ENCOUNTER — Telehealth: Payer: Self-pay | Admitting: Emergency Medicine

## 2022-10-02 NOTE — Telephone Encounter (Signed)
Spoke with patient states that she is doing ok.  Will continue as planned.  Follow up as needed.

## 2022-10-07 ENCOUNTER — Telehealth: Payer: Self-pay

## 2022-10-07 NOTE — Telephone Encounter (Signed)
Work note printed per pt request to return to work following positive covid results

## 2022-12-12 ENCOUNTER — Ambulatory Visit: Payer: Self-pay | Admitting: Obstetrics and Gynecology

## 2022-12-12 ENCOUNTER — Ambulatory Visit: Payer: Self-pay | Admitting: Family Medicine

## 2022-12-29 ENCOUNTER — Emergency Department (HOSPITAL_COMMUNITY): Payer: Self-pay

## 2022-12-29 ENCOUNTER — Emergency Department (HOSPITAL_BASED_OUTPATIENT_CLINIC_OR_DEPARTMENT_OTHER): Payer: Self-pay | Admitting: Radiology

## 2022-12-29 ENCOUNTER — Other Ambulatory Visit: Payer: Self-pay

## 2022-12-29 ENCOUNTER — Emergency Department (HOSPITAL_COMMUNITY)
Admission: EM | Admit: 2022-12-29 | Discharge: 2022-12-30 | Disposition: A | Discharge status: Home / Self Care | Payer: Self-pay | Attending: Emergency Medicine | Admitting: Emergency Medicine

## 2022-12-29 ENCOUNTER — Encounter (HOSPITAL_COMMUNITY): Payer: Self-pay | Admitting: Emergency Medicine

## 2022-12-29 ENCOUNTER — Emergency Department (HOSPITAL_BASED_OUTPATIENT_CLINIC_OR_DEPARTMENT_OTHER)
Admission: EM | Admit: 2022-12-29 | Discharge: 2022-12-29 | Disposition: A | Discharge status: Home / Self Care | Payer: Self-pay | Attending: Emergency Medicine | Admitting: Emergency Medicine

## 2022-12-29 ENCOUNTER — Encounter (HOSPITAL_BASED_OUTPATIENT_CLINIC_OR_DEPARTMENT_OTHER): Payer: Self-pay | Admitting: Emergency Medicine

## 2022-12-29 DIAGNOSIS — W06XXXA Fall from bed, initial encounter: Secondary | ICD-10-CM | POA: Insufficient documentation

## 2022-12-29 DIAGNOSIS — M25512 Pain in left shoulder: Secondary | ICD-10-CM | POA: Insufficient documentation

## 2022-12-29 DIAGNOSIS — Z5321 Procedure and treatment not carried out due to patient leaving prior to being seen by health care provider: Secondary | ICD-10-CM | POA: Insufficient documentation

## 2022-12-29 NOTE — ED Provider Triage Note (Signed)
Emergency Medicine Provider Triage Evaluation Note  Katherine Escobar , a 22 y.o. female  was evaluated in triage.  Pt complains of left Shoulder pain.  Pt reports she fell off the bed and hit her shoulder.  Pt complains of swelling and pain   Review of Systems  Positive:  Negative:   Physical Exam  BP 134/87 (BP Location: Right Arm)   Pulse 84   Temp 98.5 F (36.9 C) (Oral)   Resp 18   LMP 12/25/2022   SpO2 100%  Gen:   Awake, no distress   Resp:  Normal effort  MSK:   Tender left shoulder pain with movement  Other:    Medical Decision Making  Medically screening exam initiated at 11:32 PM.  Appropriate orders placed.  Luvada Z Friedrichs was informed that the remainder of the evaluation will be completed by another provider, this initial triage assessment does not replace that evaluation, and the importance of remaining in the ED until their evaluation is complete.     Fransico Meadow, [place] 12/29/22 2333

## 2022-12-29 NOTE — ED Triage Notes (Signed)
Pt in with L shoulder pain after falling off the bed 2 days ago. Limited ROM, denies any other injuries or LOC from fall

## 2022-12-29 NOTE — ED Triage Notes (Signed)
Patient report she fell in bed 2 days ago and hurt her left shoulder.

## 2022-12-29 NOTE — ED Notes (Signed)
Pt had xrays done at [facility].

## 2022-12-30 ENCOUNTER — Emergency Department (HOSPITAL_COMMUNITY): Payer: Self-pay

## 2022-12-30 NOTE — ED Provider Notes (Signed)
[facility] Provider Note   CSN: [id number] Arrival date & time: 12/29/22  2259     History  Chief Complaint  Patient presents with   Fall    Katherine Escobar is a 22 y.o. female.  HPI   Patient not significant medical history presenting with complaints of left-sided shoulder pain, states that she had a mechanical fall 2 days ago, states she fell off her bed, approximately 2 feet, landed her left shoulder, denies hitting her head or losing conscious, not endorsing any headache change in vision paresthesias or weakness in the upper or lower extremities, denies any neck pain or back pain no difficulty breathing.  Pain is isolated to the left shoulder, will occasionally have paresthesias moving down her left arm, but she still able to move her fingers wrist and elbow.  Endorses difficulty with left shoulder movement due to pain states has been taking Tylenol with much relief.  Patient was seen earlier at [place] but left due to the wait, she had an x-ray of the left shoulder which is negative for acute findings.  Home Medications Prior to Admission medications   Medication Sig Start Date End Date Taking? Authorizing Provider  ibuprofen (ADVIL) 800 MG tablet Take 1 tablet (800 mg total) by mouth 3 (three) times daily. 06/21/22   Raspet, Junie Panning K, PA-C  lidocaine (XYLOCAINE) 2 % solution Use as directed 15 mLs in the mouth or throat as needed for mouth pain. 03/17/22   Delia Heady, PA-C      Allergies    Patient has no known allergies.    Review of Systems   Review of Systems  Constitutional:  Negative for chills and fever.  Respiratory:  Negative for shortness of breath.   Cardiovascular:  Negative for chest pain.  Gastrointestinal:  Negative for abdominal pain.  Musculoskeletal:        Left-sided shoulder pain  Neurological:  Negative for headaches.    Physical Exam Updated Vital Signs BP 134/87 (BP Location: Right Arm)   Pulse  84   Temp 98.5 F (36.9 C) (Oral)   Resp 18   LMP 12/26/2022   SpO2 100%  Physical Exam Vitals and nursing note reviewed.  Constitutional:      General: She is not in acute distress.    Appearance: She is not ill-appearing.  HENT:     Head: Normocephalic and atraumatic.     Nose: No congestion.  Eyes:     Conjunctiva/sclera: Conjunctivae normal.  Cardiovascular:     Rate and Rhythm: Normal rate and regular rhythm.     Pulses: Normal pulses.     Heart sounds: No murmur heard.    No friction rub. No gallop.  Pulmonary:     Effort: No respiratory distress.     Breath sounds: No wheezing, rhonchi or rales.     Comments: Palpated patient's chest, she has notable tenderness on the anterior midclavicular aspect of the second rib on the left side.  Without crepitus or deformities noted. Musculoskeletal:     Comments: Spine was palpated was nontender to palpation no step-off or deformities noted.  Full exam of the left shoulder was unremarkable, there is no deformities present, she is point tenderness noted on the distal end of her clavicle, she has limited range of motion of the left shoulder due to pain, she has full range of motion the elbow wrist and fingers, sensation tact light touch, compartments soft, she has 2+ radial pulses.  Skin:    General: Skin is warm and dry.  Neurological:     Mental Status: She is alert.  Psychiatric:        Mood and Affect: Mood normal.     ED Results / Procedures / Treatments   Labs (all labs ordered are listed, but only abnormal results are displayed) Labs Reviewed - No data to display  EKG None  Radiology DG Ribs Unilateral W/Chest Left  Result Date: 12/30/2022 CLINICAL DATA:  Fall from bed 2 days ago with left-sided chest pain, initial encounter EXAM: LEFT RIBS AND CHEST - 3+ VIEW COMPARISON:  None Available. FINDINGS: No fracture or other bone lesions are seen involving the ribs. There is no evidence of pneumothorax or pleural effusion.  Both lungs are clear. Heart size and mediastinal contours are within normal limits. IMPRESSION: No acute rib abnormality noted. Electronically Signed   By: Inez Catalina M.D.   On: 12/30/2022 01:03   DG Shoulder Left  Result Date: 12/29/2022 CLINICAL DATA:  Left shoulder pain following fall 2 days ago, initial encounter EXAM: LEFT SHOULDER - 2+ VIEW COMPARISON:  None Available. FINDINGS: There is no evidence of fracture or dislocation. There is no evidence of arthropathy or other focal bone abnormality. Soft tissues are unremarkable. IMPRESSION: No acute abnormality noted. Electronically Signed   By: Inez Catalina M.D.   On: 12/29/2022 21:34    Procedures Procedures    Medications Ordered in ED Medications - No data to display  ED Course/ Medical Decision Making/ A&P                             Medical Decision Making Amount and/or Complexity of Data Reviewed Radiology: ordered.   This patient presents to the ED for concern of left shoulder pain, this involves an extensive number of treatment options, and is a complaint that carries with it a high risk of complications and morbidity.  The differential diagnosis includes fracture, dislocation, pneumothorax    Additional history obtained:  Additional history obtained from partner at bedside External records from outside source obtained and reviewed including recent ER notes   Co morbidities that complicate the patient evaluation  N/A  Social Determinants of Health:  No primary care provider    Lab Tests:  I Ordered, and personally interpreted labs.  The pertinent results include: N/A   Imaging Studies ordered:  I ordered imaging studies including x-ray of the left ribs I independently visualized and interpreted imaging which showed negative acute findings I agree with the radiologist interpretation   Cardiac Monitoring:  The patient was maintained on a cardiac monitor.  I personally viewed and interpreted the  cardiac monitored which showed an underlying rhythm of: N/A   Medicines ordered and prescription drug management:  I ordered medication including N/A I have reviewed the patients home medicines and have made adjustments as needed  Critical Interventions:  N/A   Reevaluation:  Benign physical exam, x-ray of the left shoulder at [facility] is unremarkable, will add on left ribs for further evaluation  Updated patient on imaging, will place in sling, in agreement discharge at this time.  Consultations Obtained:  N/a    Test Considered:  N/A    Rule out  Low suspicion for fracture or dislocation as x-ray does not feel any significant findings.  Suspicion for pneumothorax/rib fracture is low at this time as lung sounds are clear bilaterally, there is no significant findings seen on  x-ray of the left ribs.   Low suspicion for compartment syndrome as area was palpated it was soft to the touch, neurovascular fully intact.     Dispostion and problem list  After consideration of the diagnostic results and the patients response to treatment, I feel that the patent would benefit from discharge.  Left shoulder/rib pain-suspect muscular in nature, but possible could be underlying rotator cuff injury, will place in a sling, recommend over-the-counter pain medications, follow-up with orthopedics for further evaluation and strict return precautions.            Final Clinical Impression(s) / ED Diagnoses Final diagnoses:  Acute pain of left shoulder    Rx / DC Orders ED Discharge Orders     None         Marcello Fennel, PA-C 12/30/22 0232    Godfrey Pick, MD 12/31/22 [phone]

## 2022-12-30 NOTE — ED Notes (Signed)
Discharge papers reviewed with pt, sling applied.

## 2022-12-30 NOTE — Discharge Instructions (Signed)
You have been seen here for shoulder/chest pain. I recommend taking over-the-counter pain medications like ibuprofen and/or Tylenol every 6 as needed.  Please follow dosage and on the back of bottle.  I also recommend applying heat to the area and stretching out the muscles as this will help decrease stiffness and pain.  I have given you information on exercises please follow.  If symptoms or not proving after a week's time please follow-up with orthopedics for further evaluation.  Come back to the emergency department if you develop chest pain, shortness of breath, severe abdominal pain, uncontrolled nausea, vomiting, diarrhea.

## 2023-02-12 ENCOUNTER — Emergency Department (HOSPITAL_COMMUNITY)
Admission: EM | Admit: 2023-02-12 | Discharge: 2023-02-12 | Disposition: A | Discharge status: Home / Self Care | Payer: Self-pay | Attending: Emergency Medicine | Admitting: Emergency Medicine

## 2023-02-12 ENCOUNTER — Encounter (HOSPITAL_COMMUNITY): Payer: Self-pay

## 2023-02-12 ENCOUNTER — Other Ambulatory Visit: Payer: Self-pay

## 2023-02-12 ENCOUNTER — Emergency Department (HOSPITAL_COMMUNITY): Payer: Self-pay

## 2023-02-12 DIAGNOSIS — F410 Panic disorder [episodic paroxysmal anxiety] without agoraphobia: Secondary | ICD-10-CM | POA: Insufficient documentation

## 2023-02-12 DIAGNOSIS — J45909 Unspecified asthma, uncomplicated: Secondary | ICD-10-CM | POA: Insufficient documentation

## 2023-02-12 DIAGNOSIS — R0789 Other chest pain: Secondary | ICD-10-CM | POA: Insufficient documentation

## 2023-02-12 DIAGNOSIS — R079 Chest pain, unspecified: Secondary | ICD-10-CM

## 2023-02-12 NOTE — Discharge Instructions (Addendum)
Your EKG and chest x-ray today looked good.  You most likely had a panic attack today while at work however if you have persistent symptoms you feel like you are still having issues breathing start passing out or have other concerns return to the emergency room.

## 2023-02-12 NOTE — ED Provider Notes (Signed)
[place] EMERGENCY DEPARTMENT AT [facility], [place] Provider Note   CSN: [id number] Arrival date & time: 02/12/23  1100     History  Chief Complaint  Patient presents with   Chest Pain    Tightness    Katherine Escobar is a 22 y.o. female.  Patient is a 22 year old female with a history of childhood asthma who is presenting today due to chest tightness and shortness of breath while she was at work.  She reports that it started suddenly at work and she became very panicked.  She felt her chest was tight she could not catch her breath and her fingers were tingling.  Her brother had to come up to the job and was able to calm her down to some.  She reports now her symptoms of almost completely resolved.  She is having some mild tightness in her chest but denies any shortness of breath at this time.  She has not had any recent illness denies allergies or asthma symptoms.  She has not used inhaler in years.  She denies coming in contact with any smoke or other noxious exposures.  She had no nausea or vomiting.  She does not take any birth control pills is having her periods regularly last 1 was less than a month ago and does not have any leg pain or swelling.  No recent immobilization or family history of PE/DVT.  The history is provided by the patient.  Chest Pain      Home Medications Prior to Admission medications   Medication Sig Start Date End Date Taking? Authorizing Provider  ibuprofen (ADVIL) 800 MG tablet Take 1 tablet (800 mg total) by mouth 3 (three) times daily. 06/21/22   Raspet, Denny Peon K, PA-C  lidocaine (XYLOCAINE) 2 % solution Use as directed 15 mLs in the mouth or throat as needed for mouth pain. 03/17/22   Dietrich Pates, PA-C      Allergies    Patient has no known allergies.    Review of Systems   Review of Systems  Cardiovascular:  Positive for chest pain.    Physical Exam Updated Vital Signs BP (!) 145/94   Pulse 86   Temp 99.6 F (37.6 C) (Oral)   Resp 18    Ht  (1.6 m)   Wt 121.1 kg   SpO2 100%   BMI 47.30 kg/m  Physical Exam Vitals and nursing note reviewed.  Constitutional:      General: She is not in acute distress.    Appearance: She is well-developed.  HENT:     Head: Normocephalic and atraumatic.  Eyes:     Pupils: Pupils are equal, round, and reactive to light.  Cardiovascular:     Rate and Rhythm: Normal rate and regular rhythm.     Heart sounds: Normal heart sounds. No murmur heard.    No friction rub.  Pulmonary:     Effort: Pulmonary effort is normal.     Breath sounds: Normal breath sounds. No wheezing or rales.  Chest:     Chest wall: Tenderness present.  Abdominal:     General: Bowel sounds are normal. There is no distension.     Palpations: Abdomen is soft.     Tenderness: There is no abdominal tenderness. There is no guarding or rebound.  Musculoskeletal:        General: No tenderness. Normal range of motion.     Right lower leg: No edema.     Left lower leg: No  edema.     Comments: No edema  Skin:    General: Skin is warm and dry.     Findings: No rash.  Neurological:     Mental Status: She is alert and oriented to person, place, and time.     Cranial Nerves: No cranial nerve deficit.  Psychiatric:        Behavior: Behavior normal.     ED Results / Procedures / Treatments   Labs (all labs ordered are listed, but only abnormal results are displayed) Labs Reviewed - No data to display  EKG EKG Interpretation  Date/Time:  Thursday February 12 2023 11:14:26 EDT Ventricular Rate:  85 PR Interval:  172 QRS Duration: 92 QT Interval:  362 QTC Calculation: 431 R Axis:   53 Text Interpretation: Sinus rhythm Consider left atrial enlargement No significant change since last tracing Confirmed by Gwyneth Sprout ([id number]) on 02/12/2023 11:40:11 AM  Radiology DG Chest Port 1 View  Result Date: 02/12/2023 CLINICAL DATA:  Chest pain. EXAM: PORTABLE CHEST 1 VIEW COMPARISON:  Chest and rib radiographs  12/30/2022 FINDINGS: Similar low lung volumes to prior exam.The cardiomediastinal contours are normal. The lungs are clear. Pulmonary vasculature is normal. No consolidation, pleural effusion, or pneumothorax. No acute osseous abnormalities are seen. IMPRESSION: Low lung volumes without acute chest finding. Electronically Signed   By: Narda Rutherford M.D.   On: 02/12/2023 13:00    Procedures Procedures    Medications Ordered in ED Medications - No data to display  ED Course/ Medical Decision Making/ A&P                             Medical Decision Making Amount and/or Complexity of Data Reviewed Radiology: ordered and independent interpretation performed. Decision-making details documented in ED Course. ECG/medicine tests: ordered and independent interpretation performed. Decision-making details documented in ED Course.   Pt presenting today with a complaint that caries a high risk for morbidity and mortality.  Who is presenting today with sudden onset of chest pain and shortness of breath.  She describes it as a tightness.  This all started suddenly as well as paresthesias in her fingers and she reports she has had a panic attack in the past and this felt similar.  She is PERC negative, does not have history suggestive of a PE.  Breath sounds are clear bilaterally without findings concerning for asthma.  I independently interpreted patient's EKG which shows no acute findings no evidence of ACS.  Symptoms are not classic for pericarditis, dissection.  Will do a chest x-ray to ensure no pneumothorax.  Patient reports overall she is feeling much better.  1:38 PM I have independently visualized and interpreted pt's images today.  Chest x-ray within normal limits today.  No evidence of pneumothorax or consolidation.  On repeat evaluation patient is still feeling better and feel that she is stable for discharge.          Final Clinical Impression(s) / ED Diagnoses Final diagnoses:   Nonspecific chest pain  Panic attack    Rx / DC Orders ED Discharge Orders     None         Gwyneth Sprout, MD 02/12/23 1338

## 2023-02-12 NOTE — ED Triage Notes (Signed)
Chest tightness and sob while at work. Pt states this is still ongoing but has improved. Pt denies recent sickness. States increase in stress due to starting a new job. Pt does not appear in respiratory distress at this time.

## 2023-02-23 ENCOUNTER — Encounter (HOSPITAL_COMMUNITY): Payer: Self-pay

## 2023-02-23 ENCOUNTER — Emergency Department (HOSPITAL_COMMUNITY)
Admission: EM | Admit: 2023-02-23 | Discharge: 2023-02-23 | Disposition: A | Discharge status: Home / Self Care | Payer: Self-pay | Attending: Emergency Medicine | Admitting: Emergency Medicine

## 2023-02-23 ENCOUNTER — Other Ambulatory Visit: Payer: Self-pay

## 2023-02-23 DIAGNOSIS — J45909 Unspecified asthma, uncomplicated: Secondary | ICD-10-CM | POA: Insufficient documentation

## 2023-02-23 DIAGNOSIS — R0602 Shortness of breath: Secondary | ICD-10-CM | POA: Insufficient documentation

## 2023-02-23 DIAGNOSIS — F410 Panic disorder [episodic paroxysmal anxiety] without agoraphobia: Secondary | ICD-10-CM | POA: Insufficient documentation

## 2023-02-23 DIAGNOSIS — F419 Anxiety disorder, unspecified: Secondary | ICD-10-CM | POA: Insufficient documentation

## 2023-02-23 LAB — CBC
HCT: 40.5 % (ref 36.0–46.0)
Hemoglobin: 11.2 g/dL — ABNORMAL LOW (ref 12.0–15.0)
MCH: 26.2 pg (ref 26.0–34.0)
MCHC: 27.7 g/dL — ABNORMAL LOW (ref 30.0–36.0)
MCV: 94.8 fL (ref 80.0–100.0)
Platelets: 170 10*3/uL (ref 150–400)
RBC: 4.27 MIL/uL (ref 3.87–5.11)
RDW: 15.9 % — ABNORMAL HIGH (ref 11.5–15.5)
WBC: 6.8 10*3/uL (ref 4.0–10.5)
nRBC: 0 % (ref 0.0–0.2)

## 2023-02-23 LAB — BASIC METABOLIC PANEL
Anion gap: 8 (ref 5–15)
BUN: 7 mg/dL (ref 6–20)
CO2: 20 mmol/L — ABNORMAL LOW (ref 22–32)
Calcium: 8.8 mg/dL — ABNORMAL LOW (ref 8.9–10.3)
Chloride: 107 mmol/L (ref 98–111)
Creatinine, Ser: 0.59 mg/dL (ref 0.44–1.00)
GFR, Estimated: >60 mL/min (ref >60)
Glucose, Bld: 104 mg/dL — ABNORMAL HIGH (ref 70–99)
Potassium: 3.5 mmol/L (ref 3.5–5.1)
Sodium: 135 mmol/L (ref 135–145)

## 2023-02-23 LAB — TSH: TSH: 0.742 u[IU]/mL (ref 0.350–4.500)

## 2023-02-23 MED ORDER — HYDROXYZINE HCL 25 MG PO TABS: 25.0000 mg | ORAL_TABLET | Freq: Three times a day (TID) | ORAL | 0 refills | Status: DC | PRN

## 2023-02-23 NOTE — ED Provider Notes (Signed)
[place] EMERGENCY DEPARTMENT AT [facility] Provider Note   CSN: [id number] Arrival date & time: 02/23/23  1242     History  Chief Complaint  Patient presents with   Anxiety    Katherine Escobar is a 22 y.o. female.   Anxiety  Patient presents for reported panic attack.  Began earlier today and last around 10 minutes.  Has had another episode around 2 weeks ago.  Seen in the ER at that time.  No history of anxiety but states she has recently started a new job.  No stimulants.  Does occasionally smoke black and milds.  No swelling in legs.  No cough.  No weight loss.  No suicidal or homicidal thoughts.  Denies pregnancy.    Past Medical History:  Diagnosis Date   Asthma    Chlamydia 02/12/2021    Home Medications Prior to Admission medications   Medication Sig Start Date End Date Taking? Authorizing Provider  hydrOXYzine (ATARAX) 25 MG tablet Take 1 tablet (25 mg total) by mouth every 8 (eight) hours as needed for anxiety. 02/23/23  Yes Benjiman Core, MD  ibuprofen (ADVIL) 800 MG tablet Take 1 tablet (800 mg total) by mouth 3 (three) times daily. 06/21/22   Raspet, Denny Peon K, PA-C  lidocaine (XYLOCAINE) 2 % solution Use as directed 15 mLs in the mouth or throat as needed for mouth pain. 03/17/22   Dietrich Pates, PA-C      Allergies    Patient has no known allergies.    Review of Systems   Review of Systems  Physical Exam Updated Vital Signs BP 138/71 (BP Location: Left Arm)   Pulse 76   Temp 98.8 F (37.1 C) (Oral)   Resp 16   Ht 5\' 3"  (1.6 m)   Wt 120.2 kg   SpO2 99%   BMI 46.94 kg/m  Physical Exam Vitals and nursing note reviewed.  Constitutional:      Appearance: Normal appearance.  Eyes:     Pupils: Pupils are equal, round, and reactive to light.  Cardiovascular:     Rate and Rhythm: Regular rhythm.  Pulmonary:     Breath sounds: No wheezing or rhonchi.  Abdominal:     Tenderness: There is no abdominal tenderness.  Musculoskeletal:      Cervical back: Neck supple.  Skin:    Capillary Refill: Capillary refill takes less than 2 seconds.  Neurological:     Mental Status: She is oriented to person, place, and time.     ED Results / Procedures / Treatments   Labs (all labs ordered are listed, but only abnormal results are displayed) Labs Reviewed  BASIC METABOLIC PANEL - Abnormal; Notable for the following components:      Result Value   CO2 20 (*)    Glucose, Bld 104 (*)    Calcium 8.8 (*)    All other components within normal limits  CBC - Abnormal; Notable for the following components:   Hemoglobin 11.2 (*)    MCHC 27.7 (*)    RDW 15.9 (*)    All other components within normal limits  TSH    EKG None  Radiology No results found.  Procedures Procedures    Medications Ordered in ED Medications - No data to display  ED Course/ Medical Decision Making/ A&P                             Medical Decision Making  Amount and/or Complexity of Data Reviewed Labs: ordered.  Risk Prescription drug management.   Patient feeling her heart racing and shortness of breath.  Was anxious with the episode.  I think likely has an anxiety attack.  However does not really have a history of anxiety.  Will check some basic blood work and including TSH.  Denies pregnancy.  No suicidal thoughts.  Lab work reassuring.  Mild anemia but appears stable from 3 years ago.  TSH reassuring.  Outpatient follow-up will with behavioral health urgent care.  Vistaril given as needed for anxiety.        Final Clinical Impression(s) / ED Diagnoses Final diagnoses:  Anxiety    Rx / DC Orders ED Discharge Orders          Ordered    hydrOXYzine (ATARAX) 25 MG tablet  Every 8 hours PRN        02/23/23 1501              Benjiman Core, MD 02/23/23 1637

## 2023-02-23 NOTE — ED Triage Notes (Signed)
Pt states she had a panic attack while at work that lasted for 10 minutes. Another episode occurred 2 weeks ago and pt came here. Pt is not on anxiety medicine. All symptoms resolved.

## 2023-03-10 ENCOUNTER — Encounter (HOSPITAL_COMMUNITY): Payer: Self-pay | Admitting: *Deleted

## 2023-03-10 ENCOUNTER — Ambulatory Visit (HOSPITAL_COMMUNITY)
Admission: EM | Admit: 2023-03-10 | Discharge: 2023-03-10 | Disposition: A | Discharge status: Home / Self Care | Payer: Self-pay | Attending: Internal Medicine | Admitting: Internal Medicine

## 2023-03-10 DIAGNOSIS — R1030 Lower abdominal pain, unspecified: Secondary | ICD-10-CM | POA: Insufficient documentation

## 2023-03-10 LAB — POCT URINALYSIS DIP (MANUAL ENTRY)
Bilirubin, UA: NEGATIVE
Blood, UA: NEGATIVE
Glucose, UA: NEGATIVE mg/dL
Ketones, POC UA: NEGATIVE mg/dL
Leukocytes, UA: NEGATIVE
Nitrite, UA: NEGATIVE
Protein Ur, POC: NEGATIVE mg/dL
Spec Grav, UA: >=1.03 — AB (ref 1.010–1.025)
Urobilinogen, UA: 0.2 E.U./dL
pH, UA: 6.5 (ref 5.0–8.0)

## 2023-03-10 LAB — POCT URINE PREGNANCY: Preg Test, Ur: NEGATIVE

## 2023-03-10 MED ORDER — POLYETHYLENE GLYCOL 4500 POWD: 17.0000 g | 0 refills | Status: DC

## 2023-03-10 MED ORDER — IBUPROFEN 800 MG PO TABS: 800.0000 mg | ORAL_TABLET | Freq: Three times a day (TID) | ORAL | 0 refills | Status: DC

## 2023-03-10 NOTE — ED Provider Notes (Signed)
[facility]    CSN: [id number] Arrival date & time: 03/10/23  1149     History   Chief Complaint Chief Complaint  Patient presents with   Abdominal Pain    HPI Katherine Escobar is a 22 y.o. female.  Lower abdominal pain x 2-3 days 6/10 pain today. No associated nausea or vomiting. Pain does not radiate. Denies any vaginal or urinary symptoms. Sexually active and reports recent unprotected intercourse.  For a few days having hard BM and has to strain, last was yesterday. No blood in stool No fever or chills  LMP 3/26. Usually irregular cycles   Past Medical History:  Diagnosis Date   Asthma    Chlamydia 02/12/2021    Patient Active Problem List   Diagnosis Date Noted   Chlamydia 02/12/2021    History reviewed. No pertinent surgical history.  OB History     Gravida  0   Para  0   Term  0   Preterm  0   AB  0   Living  0      SAB  0   IAB  0   Ectopic  0   Multiple  0   Live Births  0            Home Medications    Prior to Admission medications   Medication Sig Start Date End Date Taking? Authorizing Provider  ibuprofen (ADVIL) 800 MG tablet Take 1 tablet (800 mg total) by mouth 3 (three) times daily. 03/10/23  Yes Duglas Heier, Lurena Joiner, PA-C  Polyethylene Glycol 4500 POWD 17 g by Does not apply route as directed. 1 capful (17g) dissolved in 1 cup of water = 1 dose 03/10/23  Yes Mazie Fencl, PA-C  hydrOXYzine (ATARAX) 25 MG tablet Take 1 tablet (25 mg total) by mouth every 8 (eight) hours as needed for anxiety. 02/23/23   Benjiman Core, MD  lidocaine (XYLOCAINE) 2 % solution Use as directed 15 mLs in the mouth or throat as needed for mouth pain. 03/17/22   Dietrich Pates, PA-C    Family History Family History  Problem Relation Age of Onset   Hypertension Mother    Hyperlipidemia Mother    Arrhythmia Mother     Social History Social History   Tobacco Use   Smoking status: Every Day    Types: Cigars    Passive exposure:  Never   Smokeless tobacco: Never  Vaping Use   Vaping Use: Never used  Substance Use Topics   Alcohol use: Yes    Comment: occasional   Drug use: Not Currently     Allergies   Patient has no known allergies.   Review of Systems Review of Systems  Gastrointestinal:  Positive for abdominal pain.   As per HPI  Physical Exam Triage Vital Signs ED Triage Vitals [03/10/23 1306]  Enc Vitals Group     BP      Pulse      Resp      Temp      Temp src      SpO2      Weight      Height      Head Circumference      Peak Flow      Pain Score 6     Pain Loc      Pain Edu?      Excl. in GC?    No data found.  Updated Vital Signs LMP 01/20/2023 Comment: irregular menses  Physical Exam Vitals and nursing note reviewed.  Constitutional:      Appearance: Normal appearance.  HENT:     Mouth/Throat:     Mouth: Mucous membranes are moist.     Pharynx: Oropharynx is clear.  Eyes:     Conjunctiva/sclera: Conjunctivae normal.  Cardiovascular:     Rate and Rhythm: Normal rate and regular rhythm.     Heart sounds: Normal heart sounds.  Pulmonary:     Effort: Pulmonary effort is normal. No respiratory distress.     Breath sounds: Normal breath sounds.  Abdominal:     General: Bowel sounds are normal.     Palpations: Abdomen is soft.     Tenderness: There is abdominal tenderness in the right lower quadrant and suprapubic area. There is no right CVA tenderness, left CVA tenderness, guarding or rebound.  Musculoskeletal:        General: Normal range of motion.  Skin:    General: Skin is warm and dry.  Neurological:     Mental Status: She is alert and oriented to person, place, and time.     UC Treatments / Results  Labs (all labs ordered are listed, but only abnormal results are displayed) Labs Reviewed  POCT URINALYSIS DIP (MANUAL ENTRY) - Abnormal; Notable for the following components:      Result Value   Color, UA orange (*)    Clarity, UA cloudy (*)    Spec  Grav, UA >=1.030 (*)    All other components within normal limits  POCT URINE PREGNANCY  CERVICOVAGINAL ANCILLARY ONLY    EKG  Radiology No results found.  Procedures Procedures (including critical care time)  Medications Ordered in UC Medications - No data to display  Initial Impression / Assessment and Plan / UC Course  I have reviewed the triage vital signs and the nursing notes.  Pertinent labs & imaging results that were available during my care of the patient were reviewed by me and considered in my medical decision making (see chart for details).  UPT negative UA elevated specific gravity. No sign of infection. Discussed increasing fluids to at least 64 ounces daily Cytology swab is pending. Will treat positive result if indicated.   We had discussed evaluation in the emergency department given she is fairly tender RLQ, without guarding or rebound. Patient would like to try symptomatic care first with strict ED precautions for any change or worsening.   Recommend stool softener for constipation which may be cause of the abd pain. Miralax, increasing fluids. Tylenol and/or ibuprofen can be tried for pain No questions at this time.  Patient verbalizes understanding of ED precautions  Final Clinical Impressions(s) / UC Diagnoses   Final diagnoses:  Lower abdominal pain     Discharge Instructions      Increase your water intake to at least 64 ounces daily!  Try ibuprofen and/or tylenol for pain and discomfort  Please try miralax (polyethylene glycol) cleanse out for constipation which may also improve your abdominal pain. Dissolve 1 capful of powder in 1 cup of water and then repeat (2 doses) Ita can take 6-12 hours for soft bowel movement to occur  We will call you if anything on your swab returns positive. Please abstain from sexual intercourse until your results return.  Please go to the emergency department if symptoms worsen.     ED Prescriptions      Medication Sig Dispense Auth. Provider   ibuprofen (ADVIL) 800 MG tablet Take 1 tablet (800 mg  total) by mouth 3 (three) times daily. 21 tablet Paul Trettin, Lurena Joiner, PA-C   Polyethylene Glycol 4500 POWD 17 g by Does not apply route as directed. 1 capful (17g) dissolved in 1 cup of water = 1 dose 500 g Marcellous Snarski, Lurena Joiner, PA-C      PDMP not reviewed this encounter.   Marlow Baars, [place] 03/10/23 1451

## 2023-03-10 NOTE — ED Triage Notes (Signed)
Pt reports lower ABD pains. Pt denies any vag discharge and urinary Sx's.

## 2023-03-10 NOTE — Discharge Instructions (Addendum)
Increase your water intake to at least 64 ounces daily!  Try ibuprofen and/or tylenol for pain and discomfort  Please try miralax (polyethylene glycol) cleanse out for constipation which may also improve your abdominal pain. Dissolve 1 capful of powder in 1 cup of water and then repeat (2 doses) Ita can take 6-12 hours for soft bowel movement to occur  We will call you if anything on your swab returns positive. Please abstain from sexual intercourse until your results return.  Please go to the emergency department if symptoms worsen.

## 2023-03-11 ENCOUNTER — Telehealth (HOSPITAL_COMMUNITY): Payer: Self-pay | Admitting: Emergency Medicine

## 2023-03-11 LAB — CERVICOVAGINAL ANCILLARY ONLY
Bacterial Vaginitis (gardnerella): POSITIVE — AB
Candida Glabrata: NEGATIVE
Candida Vaginitis: NEGATIVE
Chlamydia: POSITIVE — AB
Comment: NEGATIVE
Comment: NEGATIVE
Comment: NEGATIVE
Comment: NEGATIVE
Comment: NEGATIVE
Comment: NORMAL
Neisseria Gonorrhea: NEGATIVE
Trichomonas: NEGATIVE

## 2023-03-11 MED ORDER — DOXYCYCLINE HYCLATE 100 MG PO CAPS: 100.0000 mg | ORAL_CAPSULE | Freq: Two times a day (BID) | ORAL | 0 refills | Status: AC

## 2023-03-11 MED ORDER — METRONIDAZOLE 0.75 % VA GEL: 1.0000 | Freq: Every day | VAGINAL | 0 refills | Status: AC

## 2023-04-21 ENCOUNTER — Other Ambulatory Visit: Payer: Self-pay

## 2023-04-21 ENCOUNTER — Encounter (HOSPITAL_COMMUNITY): Payer: Self-pay

## 2023-04-21 ENCOUNTER — Emergency Department (HOSPITAL_COMMUNITY)
Admission: EM | Admit: 2023-04-21 | Discharge: 2023-04-21 | Discharge status: Against Medical Advice | Payer: Self-pay | Attending: Emergency Medicine | Admitting: Emergency Medicine

## 2023-04-21 DIAGNOSIS — R519 Headache, unspecified: Secondary | ICD-10-CM | POA: Insufficient documentation

## 2023-04-21 DIAGNOSIS — Z5321 Procedure and treatment not carried out due to patient leaving prior to being seen by health care provider: Secondary | ICD-10-CM | POA: Insufficient documentation

## 2023-04-21 DIAGNOSIS — H9201 Otalgia, right ear: Secondary | ICD-10-CM | POA: Insufficient documentation

## 2023-04-21 DIAGNOSIS — R42 Dizziness and giddiness: Secondary | ICD-10-CM | POA: Insufficient documentation

## 2023-04-21 NOTE — ED Triage Notes (Signed)
Pt complains of right ear pain, headache, and dizziness since 9 pm last night.

## 2023-04-21 NOTE — ED Notes (Signed)
Patient no longer in waiting room 

## 2023-05-03 ENCOUNTER — Ambulatory Visit (HOSPITAL_COMMUNITY)
Admission: EM | Admit: 2023-05-03 | Discharge: 2023-05-03 | Disposition: A | Discharge status: Home / Self Care | Payer: Self-pay | Attending: Emergency Medicine | Admitting: Emergency Medicine

## 2023-05-03 ENCOUNTER — Other Ambulatory Visit: Payer: Self-pay

## 2023-05-03 ENCOUNTER — Encounter (HOSPITAL_COMMUNITY): Payer: Self-pay | Admitting: *Deleted

## 2023-05-03 DIAGNOSIS — B349 Viral infection, unspecified: Secondary | ICD-10-CM | POA: Insufficient documentation

## 2023-05-03 DIAGNOSIS — Z1152 Encounter for screening for COVID-19: Secondary | ICD-10-CM | POA: Insufficient documentation

## 2023-05-03 LAB — POCT INFLUENZA A/B
Influenza A, POC: NEGATIVE
Influenza B, POC: NEGATIVE

## 2023-05-03 NOTE — Discharge Instructions (Addendum)
Your flu testing was negative. We have obtained COVID-19 testing and we will contact you if positive.  Please continue symptomatic management at home.  You can alternate between 800 mg of ibuprofen and 500 mg of Tylenol as needed every 4-6 hours for fever, body aches, headache and other symptoms.  Ensure you are staying hydrated with at least 64 ounces of water daily.  For your scratchy throat you can sleep with a humidifier, do warm saline gargles, drink tea with honey, and over-the-counter throat lozenges.  Please return to clinic or follow-up with your primary care provider if you do not have any improvement in symptoms over the next 7 to 10 days, or you develop any new concerning symptoms.

## 2023-05-03 NOTE — ED Provider Notes (Signed)
[facility]    CSN: [id number] Arrival date & time: 05/03/23  1058      History   Chief Complaint Chief Complaint  Patient presents with   scratchy throat   Migraine    HPI Katherine Escobar is a 22 y.o. female.   Patient reports scratchy throat, body aches, fatigue and a bad headache that started yesterday morning.   She did try Tylenol yesterday, had a fever of 103.  She is concerned for the flu.  She does have a intermittent nonproductive cough, denies shortness of breath, wheezing or chest pain.  She does smoke black and milds every other day.    The history is provided by the patient and medical records.  Migraine Associated symptoms include headaches. Pertinent negatives include no chest pain, no abdominal pain and no shortness of breath.    Past Medical History:  Diagnosis Date   Asthma    Chlamydia 02/12/2021    Patient Active Problem List   Diagnosis Date Noted   Chlamydia 02/12/2021    History reviewed. No pertinent surgical history.  OB History     Gravida  0   Para  0   Term  0   Preterm  0   AB  0   Living  0      SAB  0   IAB  0   Ectopic  0   Multiple  0   Live Births  0            Home Medications    Prior to Admission medications   Medication Sig Start Date End Date Taking? Authorizing Provider  hydrOXYzine (ATARAX) 25 MG tablet Take 1 tablet (25 mg total) by mouth every 8 (eight) hours as needed for anxiety. 02/23/23  Yes Benjiman Core, MD    Family History Family History  Problem Relation Age of Onset   Hypertension Mother    Hyperlipidemia Mother    Arrhythmia Mother     Social History Social History   Tobacco Use   Smoking status: Every Day    Types: Cigars    Passive exposure: Never   Smokeless tobacco: Never  Vaping Use   Vaping Use: Never used  Substance Use Topics   Alcohol use: Yes    Comment: occasional   Drug use: Not Currently     Allergies   Lemon flavor, Pineapple,  and Strawberry flavor   Review of Systems Review of Systems  Constitutional:  Positive for fever.  Respiratory:  Positive for cough. Negative for shortness of breath and wheezing.   Cardiovascular:  Negative for chest pain.  Gastrointestinal:  Negative for abdominal pain, diarrhea, nausea and vomiting.  Neurological:  Positive for headaches.     Physical Exam Triage Vital Signs ED Triage Vitals  Enc Vitals Group     BP 05/03/23 1133 108/75     Pulse Rate 05/03/23 1133 76     Resp 05/03/23 1133 20     Temp 05/03/23 1133 98 F (36.7 C)     Temp src --      SpO2 05/03/23 1133 96 %     Weight --      Height --      Head Circumference --      Peak Flow --      Pain Score 05/03/23 1131 6     Pain Loc --      Pain Edu? --      Excl. in GC? --  No data found.  Updated Vital Signs BP 108/75   Pulse 76   Temp 98 F (36.7 C)   Resp 20   LMP 04/13/2023   SpO2 96%   Visual Acuity Right Eye Distance:   Left Eye Distance:   Bilateral Distance:    Right Eye Near:   Left Eye Near:    Bilateral Near:     Physical Exam Vitals and nursing note reviewed.  Constitutional:      Appearance: Normal appearance.  HENT:     Head: Normocephalic and atraumatic.     Right Ear: External ear normal.     Left Ear: External ear normal.     Nose: Nose normal.     Mouth/Throat:     Mouth: Mucous membranes are moist.     Pharynx: Posterior oropharyngeal erythema present.  Eyes:     Conjunctiva/sclera: Conjunctivae normal.  Cardiovascular:     Rate and Rhythm: Normal rate and regular rhythm.     Heart sounds: Normal heart sounds. No murmur heard. Pulmonary:     Effort: Pulmonary effort is normal. No respiratory distress.     Breath sounds: Normal breath sounds.  Musculoskeletal:        General: Normal range of motion.     Cervical back: Normal range of motion.  Skin:    General: Skin is warm and dry.  Neurological:     General: No focal deficit present.     Mental Status:  She is alert and oriented to person, place, and time.  Psychiatric:        Mood and Affect: Mood normal.      UC Treatments / Results  Labs (all labs ordered are listed, but only abnormal results are displayed) Labs Reviewed  SARS CORONAVIRUS 2 (TAT 6-24 HRS)  POCT INFLUENZA A/B    EKG   Radiology No results found.  Procedures Procedures (including critical care time)  Medications Ordered in UC Medications - No data to display  Initial Impression / Assessment and Plan / UC Course  I have reviewed the triage vital signs and the nursing notes.  Pertinent labs & imaging results that were available during my care of the patient were reviewed by me and considered in my medical decision making (see chart for details).  Vitals and triage reviewed, patient is hemodynamically stable.  Lungs are vesicular posteriorly, heart w/ RRR, posterior pharynx with erythema.  Uvula is midline, tonsils without exudate or edema, low concern for peritonsillar abscess or acute bacterial process.  Discussed this is most likely viral in nature.  Patient requesting flu testing, which was negative.  COVID-19 testing obtained.  Patient declined Tylenol or ibuprofen in clinic for headache.  Symptomatic management given.  Plan of care, follow-up care and return precautions given, no questions at this time.     Final Clinical Impressions(s) / UC Diagnoses   Final diagnoses:  Viral illness     Discharge Instructions      Your flu testing was negative. We have obtained COVID-19 testing and we will contact you if positive.  Please continue symptomatic management at home.  You can alternate between 800 mg of ibuprofen and 500 mg of Tylenol as needed every 4-6 hours for fever, body aches, headache and other symptoms.  Ensure you are staying hydrated with at least 64 ounces of water daily.  For your scratchy throat you can sleep with a humidifier, do warm saline gargles, drink tea with honey, and  over-the-counter throat lozenges.  Please  return to clinic or follow-up with your primary care provider if you do not have any improvement in symptoms over the next 7 to 10 days, or you develop any new concerning symptoms.      ED Prescriptions   None    PDMP not reviewed this encounter.   Phynix Horton, [place], [place] 05/03/23 [phone]

## 2023-05-03 NOTE — ED Triage Notes (Signed)
Pt reports sx's started SAT morning. Pt has a scratchy throat and migrain.

## 2023-05-04 LAB — SARS CORONAVIRUS 2 (TAT 6-24 HRS): SARS Coronavirus 2: NEGATIVE

## 2023-06-29 ENCOUNTER — Emergency Department (HOSPITAL_COMMUNITY)
Admission: EM | Admit: 2023-06-29 | Discharge: 2023-06-30 | Discharge status: Against Medical Advice | Payer: Self-pay | Attending: Emergency Medicine | Admitting: Emergency Medicine

## 2023-06-29 DIAGNOSIS — H9202 Otalgia, left ear: Secondary | ICD-10-CM | POA: Insufficient documentation

## 2023-06-29 DIAGNOSIS — Z5321 Procedure and treatment not carried out due to patient leaving prior to being seen by health care provider: Secondary | ICD-10-CM | POA: Insufficient documentation

## 2023-06-30 ENCOUNTER — Other Ambulatory Visit: Payer: Self-pay

## 2023-06-30 ENCOUNTER — Encounter (HOSPITAL_COMMUNITY): Payer: Self-pay

## 2023-06-30 MED ORDER — IBUPROFEN 400 MG PO TABS
600.0000 mg | ORAL_TABLET | Freq: Once | ORAL | Status: AC
  Administered 2023-06-30: 600 mg via ORAL
  Filled 2023-06-30: qty 1

## 2023-06-30 NOTE — ED Notes (Signed)
Left without being seen.

## 2023-06-30 NOTE — ED Triage Notes (Signed)
Pt arrives with c/o left ear pain that started today. Per pt, pain radiates down into jaw. Pt has hx of recurrent ear infections. Pt denies fevers.

## 2023-09-01 ENCOUNTER — Ambulatory Visit (HOSPITAL_COMMUNITY)
Admission: EM | Admit: 2023-09-01 | Discharge: 2023-09-01 | Disposition: A | Discharge status: Home / Self Care | Payer: Self-pay | Attending: Physician Assistant | Admitting: Physician Assistant

## 2023-09-01 ENCOUNTER — Encounter (HOSPITAL_COMMUNITY): Payer: Self-pay | Admitting: *Deleted

## 2023-09-01 DIAGNOSIS — J029 Acute pharyngitis, unspecified: Secondary | ICD-10-CM

## 2023-09-01 LAB — POCT RAPID STREP A (OFFICE): Rapid Strep A Screen: NEGATIVE

## 2023-09-01 NOTE — ED Triage Notes (Signed)
Pt states that last night she started with a fever of 101. Today she woke up with sore throat. She hasn't taken any meds.

## 2023-09-01 NOTE — ED Provider Notes (Signed)
[facility]    CSN: [id number] Arrival date & time: 09/01/23  1556      History   Chief Complaint Chief Complaint  Patient presents with   Fever   Sore Throat    HPI Katherine Escobar is a 22 y.o. female.   Patient complains of a sore throat.  She reports she began feeling better last p.m.  Patient reports her throat feels worse today.  Patient reports she was exposed to 2 coworkers who were sick and had sore throats.  The history is provided by the patient. No language interpreter was used.  Fever Temp source:  Subjective Severity:  Mild Duration:  1 day Timing:  Constant Progression:  Worsening Chronicity:  New Relieved by:  Nothing Worsened by:  Nothing Associated symptoms: cough   Sore Throat    Past Medical History:  Diagnosis Date   Asthma    Chlamydia 02/12/2021    Patient Active Problem List   Diagnosis Date Noted   Chlamydia 02/12/2021    History reviewed. No pertinent surgical history.  OB History     Gravida  0   Para  0   Term  0   Preterm  0   AB  0   Living  0      SAB  0   IAB  0   Ectopic  0   Multiple  0   Live Births  0            Home Medications    Prior to Admission medications   Medication Sig Start Date End Date Taking? Authorizing Provider  hydrOXYzine (ATARAX) 25 MG tablet Take 1 tablet (25 mg total) by mouth every 8 (eight) hours as needed for anxiety. 02/23/23   Benjiman Core, MD    Family History Family History  Problem Relation Age of Onset   Hypertension Mother    Hyperlipidemia Mother    Arrhythmia Mother     Social History Social History   Tobacco Use   Smoking status: Every Day    Types: Cigars    Passive exposure: Never   Smokeless tobacco: Never  Vaping Use   Vaping status: Never Used  Substance Use Topics   Alcohol use: Yes    Comment: occasional   Drug use: Not Currently     Allergies   Lemon flavor, Pineapple, and Strawberry flavor   Review of  Systems Review of Systems  Constitutional:  Positive for fever.  Respiratory:  Positive for cough.   All other systems reviewed and are negative.    Physical Exam Triage Vital Signs ED Triage Vitals  Encounter Vitals Group     BP 09/01/23 1716 120/72     Systolic BP Percentile --      Diastolic BP Percentile --      Pulse Rate 09/01/23 1716 78     Resp 09/01/23 1716 18     Temp 09/01/23 1716 98.6 F (37 C)     Temp Source 09/01/23 1716 Oral     SpO2 09/01/23 1716 98 %     Weight --      Height --      Head Circumference --      Peak Flow --      Pain Score 09/01/23 1715 8     Pain Loc --      Pain Education --      Exclude from Growth Chart --    No data found.  Updated Vital Signs  BP 120/72 (BP Location: Right Arm)   Pulse 78   Temp 98.6 F (37 C) (Oral)   Resp 18   LMP 08/02/2023 (Exact Date)   SpO2 98%   Visual Acuity Right Eye Distance:   Left Eye Distance:   Bilateral Distance:    Right Eye Near:   Left Eye Near:    Bilateral Near:     Physical Exam Vitals and nursing note reviewed.  Constitutional:      Appearance: She is well-developed.  HENT:     Head: Normocephalic.     Right Ear: Tympanic membrane normal.     Left Ear: Tympanic membrane normal.     Mouth/Throat:     Mouth: Mucous membranes are moist.  Cardiovascular:     Rate and Rhythm: Normal rate.  Pulmonary:     Effort: Pulmonary effort is normal.  Abdominal:     General: There is no distension.  Musculoskeletal:        General: Normal range of motion.     Cervical back: Normal range of motion.  Skin:    General: Skin is warm.  Neurological:     General: No focal deficit present.     Mental Status: She is alert and oriented to person, place, and time.  Psychiatric:        Mood and Affect: Mood normal.      UC Treatments / Results  Labs (all labs ordered are listed, but only abnormal results are displayed) Labs Reviewed  POCT RAPID STREP A (OFFICE)     EKG   Radiology No results found.  Procedures Procedures (including critical care time)  Medications Ordered in UC Medications - No data to display  Initial Impression / Assessment and Plan / UC Course  I have reviewed the triage vital signs and the nursing notes.  Pertinent labs & imaging results that were available during my care of the patient were reviewed by me and considered in my medical decision making (see chart for details).     Strep screen is negative Final Clinical Impressions(s) / UC Diagnoses   Final diagnoses:  Viral pharyngitis     Discharge Instructions      Tylenol every 4 hours for fever or pain.  Return if any problems.    ED Prescriptions   None    PDMP not reviewed this encounter. An After Visit Summary was printed and given to the patient.    Elson Areas, [place] 09/01/23 1800

## 2023-09-01 NOTE — Discharge Instructions (Addendum)
Tylenol every 4 hours for fever or pain.  Return if any problems.

## 2023-09-11 ENCOUNTER — Ambulatory Visit
Admission: EM | Admit: 2023-09-11 | Discharge: 2023-09-11 | Disposition: A | Discharge status: Home / Self Care | Payer: Self-pay | Attending: Family Medicine | Admitting: Family Medicine

## 2023-09-11 ENCOUNTER — Other Ambulatory Visit: Payer: Self-pay

## 2023-09-11 ENCOUNTER — Ambulatory Visit (HOSPITAL_COMMUNITY): Payer: Self-pay

## 2023-09-11 DIAGNOSIS — J4520 Mild intermittent asthma, uncomplicated: Secondary | ICD-10-CM

## 2023-09-11 DIAGNOSIS — J069 Acute upper respiratory infection, unspecified: Secondary | ICD-10-CM

## 2023-09-11 LAB — POC COVID19/FLU A&B COMBO
Covid Antigen, POC: NEGATIVE
Influenza A Antigen, POC: NEGATIVE
Influenza B Antigen, POC: NEGATIVE

## 2023-09-11 MED ORDER — ALBUTEROL SULFATE HFA 108 (90 BASE) MCG/ACT IN AERS: 2.0000 | INHALATION_SPRAY | RESPIRATORY_TRACT | 0 refills | Status: DC | PRN

## 2023-09-11 MED ORDER — BENZONATATE 200 MG PO CAPS: 200.0000 mg | ORAL_CAPSULE | Freq: Three times a day (TID) | ORAL | 0 refills | Status: DC | PRN

## 2023-09-11 NOTE — ED Provider Notes (Signed)
[name]    CSN: [id number] Arrival date & time: 09/11/23  1153      History   Chief Complaint No chief complaint on file.   HPI Katherine Escobar is a 22 y.o. female.   Patient is here for URI symptoms  She was exposed to RSV at work.  Yesterday she started with runny nose, congestion, sneezing.  She felt sob last night while trying to go to bed. No otc meds used.  Some chills, no fevers.  No n/v.  She does have asthma.  She does not have an inhaler at home..       Past Medical History:  Diagnosis Date   Asthma    Chlamydia 02/12/2021    Patient Active Problem List   Diagnosis Date Noted   Chlamydia 02/12/2021    History reviewed. No pertinent surgical history.  OB History     Gravida  0   Para  0   Term  0   Preterm  0   AB  0   Living  0      SAB  0   IAB  0   Ectopic  0   Multiple  0   Live Births  0            Home Medications    Prior to Admission medications   Medication Sig Start Date End Date Taking? Authorizing Provider  hydrOXYzine (ATARAX) 25 MG tablet Take 1 tablet (25 mg total) by mouth every 8 (eight) hours as needed for anxiety. 02/23/23   Benjiman Core, MD    Family History Family History  Problem Relation Age of Onset   Hypertension Mother    Hyperlipidemia Mother    Arrhythmia Mother     Social History Social History   Tobacco Use   Smoking status: Every Day    Types: Cigars    Passive exposure: Never   Smokeless tobacco: Never  Vaping Use   Vaping status: Never Used  Substance Use Topics   Alcohol use: Yes    Comment: occasional   Drug use: Not Currently     Allergies   Lemon flavor, Pineapple, and Strawberry flavor   Review of Systems Review of Systems  Constitutional:  Negative for fever.  HENT:  Positive for congestion and rhinorrhea.   Respiratory:  Positive for shortness of breath. Negative for wheezing.   Cardiovascular: Negative.   Gastrointestinal: Negative.    Musculoskeletal: Negative.   Psychiatric/Behavioral: Negative.       Physical Exam Triage Vital Signs ED Triage Vitals  Encounter Vitals Group     BP 09/11/23 1226 123/83     Systolic BP Percentile --      Diastolic BP Percentile --      Pulse Rate 09/11/23 1226 89     Resp 09/11/23 1226 18     Temp 09/11/23 1226 98.2 F (36.8 C)     Temp Source 09/11/23 1226 Oral     SpO2 09/11/23 1226 98 %     Weight 09/11/23 1225 265 lb (120.2 kg)     Height 09/11/23 1225 5\' 2"  (1.575 m)     Head Circumference --      Peak Flow --      Pain Score 09/11/23 1225 0     Pain Loc --      Pain Education --      Exclude from Growth Chart --    No data found.  Updated Vital Signs BP 123/83 (  BP Location: Left Arm)   Pulse 89   Temp 98.2 F (36.8 C) (Oral)   Resp 18   Ht 5\' 2"  (1.575 m)   Wt 120.2 kg   LMP 09/06/2023 (Exact Date)   SpO2 98%   BMI 48.47 kg/m   Visual Acuity Right Eye Distance:   Left Eye Distance:   Bilateral Distance:    Right Eye Near:   Left Eye Near:    Bilateral Near:     Physical Exam Constitutional:      Appearance: Normal appearance. She is normal weight.  HENT:     Nose: Congestion present. No rhinorrhea.     Mouth/Throat:     Mouth: Mucous membranes are moist.     Pharynx: No oropharyngeal exudate or posterior oropharyngeal erythema.  Cardiovascular:     Rate and Rhythm: Normal rate and regular rhythm.  Pulmonary:     Effort: Pulmonary effort is normal.     Breath sounds: Normal breath sounds.  Musculoskeletal:     Cervical back: Normal range of motion and neck supple. No tenderness.  Lymphadenopathy:     Cervical: No cervical adenopathy.  Neurological:     General: No focal deficit present.     Mental Status: She is alert.  Psychiatric:        Mood and Affect: Mood normal.      UC Treatments / Results  Labs (all labs ordered are listed, but only abnormal results are displayed) Labs Reviewed  POC COVID19/FLU A&B COMBO - Normal    Flu/covid negative  EKG   Radiology No results found.  Procedures Procedures (including critical care time)  Medications Ordered in UC Medications - No data to display  Initial Impression / Assessment and Plan / UC Course  I have reviewed the triage vital signs and the nursing notes.  Pertinent labs & imaging results that were available during my care of the patient were reviewed by me and considered in my medical decision making (see chart for details).    Final Clinical Impressions(s) / UC Diagnoses   Final diagnoses:  Acute upper respiratory infection  Mild intermittent asthma, unspecified whether complicated     Discharge Instructions      You were seen today for upper respiratory symptoms.  Your flu/covid swab was negative.  This is likely viral.  I have sent out a medication to help with cough, and refilled your inhaler for you.  I recommend you get plenty of rest and increase fluids.  Please return if not improving or worsening.     ED Prescriptions     Medication Sig Dispense Auth. Provider   benzonatate (TESSALON) 200 MG capsule Take 1 capsule (200 mg total) by mouth 3 (three) times daily as needed for cough. 21 capsule Zsofia Prout, MD   albuterol (VENTOLIN HFA) 108 (90 Base) MCG/ACT inhaler Inhale 2 puffs into the lungs every 4 (four) hours as needed for wheezing or shortness of breath. 1 each Jannifer Franklin, MD      PDMP not reviewed this encounter.   Jannifer Franklin, MD 09/11/23 1348

## 2023-09-11 NOTE — Discharge Instructions (Signed)
You were seen today for upper respiratory symptoms.  Your flu/covid swab was negative.  This is likely viral.  I have sent out a medication to help with cough, and refilled your inhaler for you.  I recommend you get plenty of rest and increase fluids.  Please return if not improving or worsening.

## 2023-09-11 NOTE — ED Triage Notes (Signed)
Patient presents with symptoms runny nose, shortness of breathe, coughing x day 2. Patient states she was exposed to RSV from a coworker. No treatment used.

## 2023-09-12 ENCOUNTER — Ambulatory Visit: Payer: Self-pay

## 2023-11-25 ENCOUNTER — Other Ambulatory Visit (HOSPITAL_COMMUNITY): Payer: Self-pay

## 2024-01-21 ENCOUNTER — Ambulatory Visit (HOSPITAL_COMMUNITY)
Admission: EM | Admit: 2024-01-21 | Discharge: 2024-01-21 | Disposition: A | Discharge status: Home / Self Care | Payer: Self-pay

## 2024-03-04 ENCOUNTER — Encounter (HOSPITAL_COMMUNITY): Payer: Self-pay | Admitting: *Deleted

## 2024-03-04 ENCOUNTER — Emergency Department (HOSPITAL_COMMUNITY)
Admission: EM | Admit: 2024-03-04 | Discharge: 2024-03-05 | Discharge status: Against Medical Advice | Payer: Self-pay | Attending: Emergency Medicine | Admitting: Emergency Medicine

## 2024-03-04 ENCOUNTER — Other Ambulatory Visit: Payer: Self-pay

## 2024-03-04 DIAGNOSIS — Z5321 Procedure and treatment not carried out due to patient leaving prior to being seen by health care provider: Secondary | ICD-10-CM | POA: Insufficient documentation

## 2024-03-04 DIAGNOSIS — R519 Headache, unspecified: Secondary | ICD-10-CM | POA: Insufficient documentation

## 2024-03-04 NOTE — ED Triage Notes (Signed)
 She has had a headache since yesterday  she took med but the med has not helped that much  lmp 04-17

## 2024-03-05 NOTE — ED Notes (Signed)
 No answer

## 2024-04-26 ENCOUNTER — Ambulatory Visit (HOSPITAL_COMMUNITY): Payer: Self-pay

## 2024-05-05 NOTE — Telephone Encounter (Signed)
 Open to sign enouter

## 2024-07-09 ENCOUNTER — Emergency Department (HOSPITAL_BASED_OUTPATIENT_CLINIC_OR_DEPARTMENT_OTHER)
Admission: EM | Admit: 2024-07-09 | Discharge: 2024-07-10 | Disposition: A | Discharge status: Home / Self Care | Payer: MEDICAID | Attending: Emergency Medicine | Admitting: Emergency Medicine

## 2024-07-09 ENCOUNTER — Other Ambulatory Visit: Payer: Self-pay

## 2024-07-09 ENCOUNTER — Encounter (HOSPITAL_BASED_OUTPATIENT_CLINIC_OR_DEPARTMENT_OTHER): Payer: Self-pay

## 2024-07-09 DIAGNOSIS — N3001 Acute cystitis with hematuria: Secondary | ICD-10-CM | POA: Insufficient documentation

## 2024-07-09 DIAGNOSIS — N939 Abnormal uterine and vaginal bleeding, unspecified: Secondary | ICD-10-CM | POA: Insufficient documentation

## 2024-07-09 DIAGNOSIS — B9689 Other specified bacterial agents as the cause of diseases classified elsewhere: Secondary | ICD-10-CM | POA: Insufficient documentation

## 2024-07-09 DIAGNOSIS — N760 Acute vaginitis: Secondary | ICD-10-CM | POA: Insufficient documentation

## 2024-07-09 DIAGNOSIS — J45909 Unspecified asthma, uncomplicated: Secondary | ICD-10-CM | POA: Insufficient documentation

## 2024-07-09 NOTE — ED Triage Notes (Signed)
 Patient here POV from Home.  Endorses Lower ABD Pain for about 2 hours. Some spotting and dysuria. No N/V/D. No Fevers. More sharp.   NAD noted during Triage. A&Ox4. GCS 15. Ambulatory.

## 2024-07-10 ENCOUNTER — Emergency Department (HOSPITAL_BASED_OUTPATIENT_CLINIC_OR_DEPARTMENT_OTHER): Payer: MEDICAID

## 2024-07-10 LAB — WET PREP, GENITAL
Sperm: NONE SEEN
Trich, Wet Prep: NONE SEEN
WBC, Wet Prep HPF POC: <10 (ref <10)
Yeast Wet Prep HPF POC: NONE SEEN

## 2024-07-10 LAB — COMPREHENSIVE METABOLIC PANEL WITH GFR
ALT: 10 U/L (ref 0–44)
AST: 12 U/L — ABNORMAL LOW (ref 15–41)
Albumin: 3.7 g/dL (ref 3.5–5.0)
Alkaline Phosphatase: 103 U/L (ref 38–126)
Anion gap: 12 (ref 5–15)
BUN: 7 mg/dL (ref 6–20)
CO2: 22 mmol/L (ref 22–32)
Calcium: 9.4 mg/dL (ref 8.9–10.3)
Chloride: 105 mmol/L (ref 98–111)
Creatinine, Ser: 0.67 mg/dL (ref 0.44–1.00)
GFR, Estimated: >60 mL/min (ref >60)
Glucose, Bld: 99 mg/dL (ref 70–99)
Potassium: 3.7 mmol/L (ref 3.5–5.1)
Sodium: 140 mmol/L (ref 135–145)
Total Bilirubin: 0.2 mg/dL (ref 0.0–1.2)
Total Protein: 7.1 g/dL (ref 6.5–8.1)

## 2024-07-10 LAB — URINALYSIS, ROUTINE W REFLEX MICROSCOPIC
Bilirubin Urine: NEGATIVE
Glucose, UA: NEGATIVE mg/dL
Nitrite: NEGATIVE
Protein, ur: 30 mg/dL — AB
RBC / HPF: >50 RBC/hpf (ref 0–5)
Specific Gravity, Urine: 1.037 — ABNORMAL HIGH (ref 1.005–1.030)
WBC, UA: >50 WBC/hpf (ref 0–5)
pH: 6.5 (ref 5.0–8.0)

## 2024-07-10 LAB — CBC
HCT: 33.5 % — ABNORMAL LOW (ref 36.0–46.0)
Hemoglobin: 10.6 g/dL — ABNORMAL LOW (ref 12.0–15.0)
MCH: 26.3 pg (ref 26.0–34.0)
MCHC: 31.6 g/dL (ref 30.0–36.0)
MCV: 83.1 fL (ref 80.0–100.0)
Platelets: 400 K/uL (ref 150–400)
RBC: 4.03 MIL/uL (ref 3.87–5.11)
RDW: 15.4 % (ref 11.5–15.5)
WBC: 12.6 K/uL — ABNORMAL HIGH (ref 4.0–10.5)
nRBC: 0 % (ref 0.0–0.2)

## 2024-07-10 LAB — LIPASE, BLOOD: Lipase: 18 U/L (ref 11–51)

## 2024-07-10 LAB — HCG, SERUM, QUALITATIVE: Preg, Serum: NEGATIVE

## 2024-07-10 MED ORDER — METRONIDAZOLE 500 MG PO TABS: 500.0000 mg | ORAL_TABLET | Freq: Two times a day (BID) | ORAL | 0 refills | Status: DC

## 2024-07-10 MED ORDER — SODIUM CHLORIDE 0.9 % IV SOLN
1.0000 g | Freq: Once | INTRAVENOUS | Status: AC
  Administered 2024-07-10: 1 g via INTRAVENOUS
  Filled 2024-07-10: qty 10

## 2024-07-10 MED ORDER — IOHEXOL 300 MG/ML  SOLN
100.0000 mL | Freq: Once | INTRAMUSCULAR | Status: AC | PRN
  Administered 2024-07-10: 120 mL via INTRAVENOUS

## 2024-07-10 MED ORDER — CEPHALEXIN 500 MG PO CAPS: 500.0000 mg | ORAL_CAPSULE | Freq: Three times a day (TID) | ORAL | 0 refills | Status: DC

## 2024-07-10 NOTE — ED Provider Notes (Signed)
 [place] EMERGENCY DEPARTMENT AT [facility] Provider Note   CSN: [id number] Arrival date & time: 07/09/24  2316     Patient presents with: Abdominal Pain   Katherine Escobar is a 23 y.o. female.  {Add pertinent medical, surgical, social history, OB history to YEP:67052} Patient with a history of asthma presenting with right sided lower abdominal pain onset 2 hours ago while she was lying in bed.  Pain is constant mildly improved with ibuprofen  but not resolved.  Pain is to her right lower quadrant and suprapubic area.  Associated with some vaginal spotting and bleeding when she wipes.  Does not think she is pregnant.  Had a normal menstrual cycle ordered this month.  No pain with urination or blood in the urine.  Nausea but no vomiting.  No chest pain or shortness of breath.  No diarrhea.  No chest pain.  No shortness of breath.  Still has appendix and gallbladder.  Noticing some redness when she wipes but no significant bleeding.  No vaginal discharge.  No chest pain or shortness of breath.  No cough or fever.  Still has appendix and gallbladder.  Nothing is the pain better or worse.  The history is provided by the patient.  Abdominal Pain Associated symptoms: nausea and vaginal bleeding   Associated symptoms: no chest pain, no dysuria, no fever, no shortness of breath and no vomiting        Prior to Admission medications   Medication Sig Start Date End Date Taking? Authorizing Provider  albuterol  (VENTOLIN  HFA) 108 (90 Base) MCG/ACT inhaler Inhale 2 puffs into the lungs every 4 (four) hours as needed for wheezing or shortness of breath. 09/11/23   Piontek, Rocky, MD  benzonatate  (TESSALON ) 200 MG capsule Take 1 capsule (200 mg total) by mouth 3 (three) times daily as needed for cough. 09/11/23   Piontek, Rocky, MD  hydrOXYzine  (ATARAX ) 25 MG tablet Take 1 tablet (25 mg total) by mouth every 8 (eight) hours as needed for anxiety. 02/23/23   Patsey Lot, MD    Allergies:  Lemon flavoring agent (non-screening), Pineapple, and Strawberry flavoring agent (non-screening)    Review of Systems  Constitutional:  Negative for activity change, appetite change and fever.  HENT:  Negative for congestion and rhinorrhea.   Respiratory:  Negative for chest tightness and shortness of breath.   Cardiovascular:  Negative for chest pain.  Gastrointestinal:  Positive for abdominal pain and nausea. Negative for vomiting.  Genitourinary:  Positive for vaginal bleeding. Negative for dysuria and frequency.  Musculoskeletal:  Negative for arthralgias and back pain.  Skin:  Negative for rash.  Neurological:  Negative for weakness and headaches.   all other systems are negative except as noted in the HPI and PMH.    Updated Vital Signs BP (!) 145/96 (BP Location: Right Arm)   Pulse 76   Temp 98.3 F (36.8 C) (Oral)   Resp 18   Ht 5' 3 (1.6 m)   Wt 117.9 kg   LMP 07/01/2024   SpO2 100%   BMI 46.06 kg/m   Physical Exam Vitals and nursing note reviewed.  Constitutional:      General: She is not in acute distress.    Appearance: She is well-developed.  HENT:     Head: Normocephalic and atraumatic.     Mouth/Throat:     Pharynx: No oropharyngeal exudate.  Eyes:     Conjunctiva/sclera: Conjunctivae normal.     Pupils: Pupils are equal, round, and reactive  to light.  Neck:     Comments: No meningismus. Cardiovascular:     Rate and Rhythm: Normal rate and regular rhythm.     Heart sounds: Normal heart sounds. No murmur heard. Pulmonary:     Effort: Pulmonary effort is normal. No respiratory distress.     Breath sounds: Normal breath sounds.  Abdominal:     Palpations: Abdomen is soft.     Tenderness: There is abdominal tenderness. There is no guarding or rebound.     Comments: Tender right lower quadrant suprapubic area without guarding or rebound.  Musculoskeletal:        General: No tenderness. Normal range of motion.     Cervical back: Normal range of motion  and neck supple.     Comments: No CVA tenderness  Skin:    General: Skin is warm.  Neurological:     Mental Status: She is alert and oriented to person, place, and time.     Cranial Nerves: No cranial nerve deficit.     Motor: No abnormal muscle tone.     Coordination: Coordination normal.     Comments:  5/5 strength throughout. CN 2-12 intact.Equal grip strength.   Psychiatric:        Behavior: Behavior normal.     (all labs ordered are listed, but only abnormal results are displayed) Labs Reviewed  LIPASE, BLOOD  COMPREHENSIVE METABOLIC PANEL WITH GFR  CBC  URINALYSIS, ROUTINE W REFLEX MICROSCOPIC  PREGNANCY, URINE    EKG: None  Radiology: No results found.  {Document cardiac monitor, telemetry assessment procedure when appropriate:32947} Procedures   Medications Ordered in the ED - No data to display    {Click here for ABCD2, HEART and other calculators REFRESH Note before signing:1}                              Medical Decision Making Amount and/or Complexity of Data Reviewed Labs: ordered. Decision-making details documented in ED Course. Radiology: ordered and independent interpretation performed. Decision-making details documented in ED Course. ECG/medicine tests: ordered and independent interpretation performed. Decision-making details documented in ED Course.   Right sided lower abdominal pain onset 2 hours ago with some vaginal spotting.  Stable vitals.  No distress.  Abdomen soft without peritoneal signs. {Document critical care time when appropriate  Document review of labs and clinical decision tools ie CHADS2VASC2, etc  Document your independent review of radiology images and any outside records  Document your discussion with family members, caretakers and with consultants  Document social determinants of health affecting pt's care  Document your decision making why or why not admission, treatments were needed:32947:::1}   Final diagnoses:  None     ED Discharge Orders     None

## 2024-07-10 NOTE — Discharge Instructions (Signed)
 Your testing is reassuring.  Take the antibiotic for urinary tract infection.  You will be called if the urine culture is positive and antibiotic needs to be changed.  Follow-up with your doctor.  Return to the ED for worsening abdominal pain, fever, vomiting or other concerns.

## 2024-07-11 LAB — URINE CULTURE

## 2024-07-11 LAB — GC/CHLAMYDIA PROBE AMP (~~LOC~~) NOT AT ARMC
Chlamydia: NEGATIVE
Comment: NEGATIVE
Comment: NORMAL
Neisseria Gonorrhea: NEGATIVE

## 2024-08-03 ENCOUNTER — Other Ambulatory Visit: Payer: Self-pay

## 2024-08-03 ENCOUNTER — Encounter (HOSPITAL_BASED_OUTPATIENT_CLINIC_OR_DEPARTMENT_OTHER): Payer: Self-pay

## 2024-08-03 ENCOUNTER — Emergency Department (HOSPITAL_BASED_OUTPATIENT_CLINIC_OR_DEPARTMENT_OTHER)
Admission: EM | Admit: 2024-08-03 | Discharge: 2024-08-03 | Disposition: A | Discharge status: Home / Self Care | Payer: Self-pay | Attending: Emergency Medicine | Admitting: Emergency Medicine

## 2024-08-03 ENCOUNTER — Emergency Department (HOSPITAL_BASED_OUTPATIENT_CLINIC_OR_DEPARTMENT_OTHER): Payer: Self-pay

## 2024-08-03 DIAGNOSIS — Z3A01 Less than 8 weeks gestation of pregnancy: Secondary | ICD-10-CM | POA: Diagnosis not present

## 2024-08-03 DIAGNOSIS — O99011 Anemia complicating pregnancy, first trimester: Secondary | ICD-10-CM | POA: Insufficient documentation

## 2024-08-03 DIAGNOSIS — O26891 Other specified pregnancy related conditions, first trimester: Secondary | ICD-10-CM | POA: Diagnosis present

## 2024-08-03 DIAGNOSIS — R1024 Suprapubic pain: Secondary | ICD-10-CM | POA: Diagnosis not present

## 2024-08-03 DIAGNOSIS — R7981 Abnormal blood-gas level: Secondary | ICD-10-CM | POA: Insufficient documentation

## 2024-08-03 LAB — URINALYSIS, ROUTINE W REFLEX MICROSCOPIC
Bilirubin Urine: NEGATIVE
Glucose, UA: NEGATIVE mg/dL
Hgb urine dipstick: NEGATIVE
Ketones, ur: NEGATIVE mg/dL
Leukocytes,Ua: NEGATIVE
Nitrite: NEGATIVE
Protein, ur: NEGATIVE mg/dL
Specific Gravity, Urine: 1.024 (ref 1.005–1.030)
pH: 6 (ref 5.0–8.0)

## 2024-08-03 LAB — CBC WITH DIFFERENTIAL/PLATELET
Abs Immature Granulocytes: 0.02 K/uL (ref 0.00–0.07)
Basophils Absolute: 0 K/uL (ref 0.0–0.1)
Basophils Relative: 0 %
Eosinophils Absolute: 0.2 K/uL (ref 0.0–0.5)
Eosinophils Relative: 2 %
HCT: 36.4 % (ref 36.0–46.0)
Hemoglobin: 11.6 g/dL — ABNORMAL LOW (ref 12.0–15.0)
Immature Granulocytes: 0 %
Lymphocytes Relative: 27 %
Lymphs Abs: 2.6 K/uL (ref 0.7–4.0)
MCH: 26.5 pg (ref 26.0–34.0)
MCHC: 31.9 g/dL (ref 30.0–36.0)
MCV: 83.3 fL (ref 80.0–100.0)
Monocytes Absolute: 0.6 K/uL (ref 0.1–1.0)
Monocytes Relative: 6 %
Neutro Abs: 6.2 K/uL (ref 1.7–7.7)
Neutrophils Relative %: 65 %
Platelets: 355 K/uL (ref 150–400)
RBC: 4.37 MIL/uL (ref 3.87–5.11)
RDW: 15.7 % — ABNORMAL HIGH (ref 11.5–15.5)
WBC: 9.6 K/uL (ref 4.0–10.5)
nRBC: 0 % (ref 0.0–0.2)

## 2024-08-03 LAB — BASIC METABOLIC PANEL WITH GFR
Anion gap: 12 (ref 5–15)
BUN: 7 mg/dL (ref 6–20)
CO2: 20 mmol/L — ABNORMAL LOW (ref 22–32)
Calcium: 9.5 mg/dL (ref 8.9–10.3)
Chloride: 105 mmol/L (ref 98–111)
Creatinine, Ser: 0.69 mg/dL (ref 0.44–1.00)
GFR, Estimated: >60 mL/min (ref >60)
Glucose, Bld: 95 mg/dL (ref 70–99)
Potassium: 3.8 mmol/L (ref 3.5–5.1)
Sodium: 137 mmol/L (ref 135–145)

## 2024-08-03 LAB — HCG, QUANTITATIVE, PREGNANCY: hCG, Beta Chain, Quant, S: 1184 m[IU]/mL — ABNORMAL HIGH (ref <5)

## 2024-08-03 LAB — PREGNANCY, URINE: Preg Test, Ur: POSITIVE — AB

## 2024-08-03 NOTE — Discharge Instructions (Addendum)
 Your ultrasound showed the earliest sign of a healthy pregnancy, however you need to have your blood checked in 48 hours to make sure that your hCG-the hormone that we used to measure pregnancy-is increasing appropriately.  You will need to establish care with an OB/GYN for a ultrasound at a later date.  Return to the emergency room if you experience worsening pain in your abdomen or lose consciousness.

## 2024-08-03 NOTE — ED Triage Notes (Signed)
 Pt reports positive at home pregnancy test. LMP 07/01/24. Endorses 'sharp' lower abd cramping x2 days. Denies any vaginal bleeding or discharge.

## 2024-08-03 NOTE — ED Provider Notes (Signed)
 [place] EMERGENCY DEPARTMENT AT [facility] Provider Note   CSN: [id number] Arrival date & time: 08/03/24  1236     Patient presents with: Abdominal Pain and Possible Pregnancy   Katherine Escobar is a 23 y.o. female.  She is here with low stabbing abdominal pain and in the setting of a positive home pregnancy test.  She said she has been pregnant once before and had a stillborn.  Last menstrual period was September 5.  She has had 2 days of lower stabbing abdominal pain.  Pregnancy test was positive last evening.  Symptoms continued today.  No vaginal bleeding or discharge.  No fevers or chills.  No urinary symptoms.   The history is provided by the patient.  Abdominal Pain Pain location:  Suprapubic Pain quality: stabbing   Pain severity:  Moderate Onset quality:  Gradual Duration:  2 days Timing:  Intermittent Progression:  Unchanged Chronicity:  New Relieved by:  None tried Worsened by:  Nothing Ineffective treatments:  None tried Associated symptoms: no constipation, no cough, no diarrhea, no fever, no hematuria, no nausea, no vaginal bleeding, no vaginal discharge and no vomiting        Prior to Admission medications   Medication Sig Start Date End Date Taking? Authorizing Provider  albuterol  (VENTOLIN  HFA) 108 (90 Base) MCG/ACT inhaler Inhale 2 puffs into the lungs every 4 (four) hours as needed for wheezing or shortness of breath. 09/11/23   Piontek, Rocky, MD  benzonatate  (TESSALON ) 200 MG capsule Take 1 capsule (200 mg total) by mouth 3 (three) times daily as needed for cough. 09/11/23   Piontek, Rocky, MD  cephALEXin  (KEFLEX ) 500 MG capsule Take 1 capsule (500 mg total) by mouth 3 (three) times daily. 07/10/24   Rancour, Garnette, MD  hydrOXYzine  (ATARAX ) 25 MG tablet Take 1 tablet (25 mg total) by mouth every 8 (eight) hours as needed for anxiety. 02/23/23   Patsey Lot, MD  metroNIDAZOLE  (FLAGYL ) 500 MG tablet Take 1 tablet (500 mg total) by mouth 2 (two)  times daily. 07/10/24   Carita Garnette, MD    Allergies: Lemon flavoring agent (non-screening), Pineapple, and Strawberry flavoring agent (non-screening)    Review of Systems  Constitutional:  Negative for fever.  Respiratory:  Negative for cough.   Gastrointestinal:  Positive for abdominal pain. Negative for constipation, diarrhea, nausea and vomiting.  Genitourinary:  Negative for hematuria, vaginal bleeding and vaginal discharge.    Updated Vital Signs BP 126/76   Pulse 88   Temp 98 F (36.7 C) (Oral)   Resp 18   Ht 5' 3 (1.6 m)   Wt 117.9 kg   LMP 07/01/2024 (Exact Date)   SpO2 100%   BMI 46.06 kg/m   Physical Exam Vitals and nursing note reviewed.  Constitutional:      General: She is not in acute distress.    Appearance: Normal appearance. She is well-developed.  HENT:     Head: Normocephalic and atraumatic.  Eyes:     Conjunctiva/sclera: Conjunctivae normal.  Cardiovascular:     Rate and Rhythm: Normal rate and regular rhythm.     Heart sounds: No murmur heard. Pulmonary:     Effort: Pulmonary effort is normal. No respiratory distress.     Breath sounds: Normal breath sounds. No stridor. No wheezing.  Abdominal:     Palpations: Abdomen is soft.     Tenderness: There is no abdominal tenderness. There is no guarding or rebound.  Musculoskeletal:  General: No deformity.     Cervical back: Neck supple.  Skin:    General: Skin is warm and dry.  Neurological:     General: No focal deficit present.     Mental Status: She is alert.     GCS: GCS eye subscore is 4. GCS verbal subscore is 5. GCS motor subscore is 6.     Gait: Gait normal.     (all labs ordered are listed, but only abnormal results are displayed) Labs Reviewed  BASIC METABOLIC PANEL WITH GFR - Abnormal; Notable for the following components:      Result Value   CO2 20 (*)    All other components within normal limits  CBC WITH DIFFERENTIAL/PLATELET - Abnormal; Notable for the following  components:   Hemoglobin 11.6 (*)    RDW 15.7 (*)    All other components within normal limits  HCG, QUANTITATIVE, PREGNANCY - Abnormal; Notable for the following components:   hCG, Beta Chain, Quant, S 1,184 (*)    All other components within normal limits  PREGNANCY, URINE - Abnormal; Notable for the following components:   Preg Test, Ur POSITIVE (*)    All other components within normal limits  URINALYSIS, ROUTINE W REFLEX MICROSCOPIC  ABO/RH    EKG: None  Radiology: US  OB LESS THAN 14 WEEKS WITH OB TRANSVAGINAL Result Date: 08/03/2024 CLINICAL DATA:  [id number] Pelvic pain [id number] EXAM: OBSTETRIC <14 WK US  AND TRANSVAGINAL OB US  TECHNIQUE: Both transabdominal and transvaginal ultrasound examinations were performed for complete evaluation of the gestation as well as the maternal uterus, adnexal regions, and pelvic cul-de-sac. Transvaginal technique was performed to assess early pregnancy. COMPARISON:  07/10/2024 FINDINGS: Intrauterine gestational sac: Single Yolk sac:  Not present, at this time. Fetal Pole:  Not present, at this time. Cardiac Activity: Not present, at this time. MSD: 4.5 mm 5w 2d Subchorionic hemorrhage:  None visualized. Maternal uterus/adnexae: Neither ovary is enlarged. No free pelvic fluid. IMPRESSION: Single intrauterine gestational sac with an estimated gestational age of [redacted] weeks, 2 days. No yolk sac or fetal pole visualized at this time, likely due to the earliness of the gestation. Close obstetric follow-up with a repeat first trimester pregnancy ultrasound in 14 days is recommended to assess for progression of the pregnancy. Electronically Signed   By: Rogelia Myers M.D.   On: 08/03/2024 15:20     Procedures   Medications Ordered in the ED - No data to display                                  Medical Decision Making Amount and/or Complexity of Data Reviewed Labs: ordered. Radiology: ordered.   This patient complains of early pregnancy and low abdominal  pain; this involves an extensive number of treatment Options and is a complaint that carries with it a high risk of complications and morbidity. The differential includes early pregnancy, miscarriage, UTI, ectopic  I ordered, reviewed and interpreted labs, which included CBC with chronically low hemoglobin, chemistries mildly low bicarb, pregnancy test positive, hCG of 1100, urinalysis without signs of infection I ordered imaging studies which included pelvic ultrasound and I independently    visualized and interpreted imaging which showed IUP Previous records obtained and reviewed in epic no recent admissions Social determinants considered, tobacco use Critical Interventions: None  After the interventions stated above, I reevaluated the patient and found patient to have benign exam and in no distress  Admission and further testing considered, her care is signed out to Dr. Mannie to follow-up on results of ultrasound.  Likely can be discharged and follow-up outpatient with GYN for close follow-up.       Final diagnoses:  Abdominal pain during pregnancy in first trimester    ED Discharge Orders     None          Towana Ozell BROCKS, MD 08/03/24 1743

## 2024-08-03 NOTE — ED Notes (Signed)
 Patient transported to [place]

## 2024-08-03 NOTE — ED Notes (Signed)
 Pt d/c instructions, medications, and follow-up care reviewed with pt. Pt verbalized understanding and had no further questions at time of d/c. Pt CA&Ox4, ambulatory, and in NAD at time of d/c

## 2024-08-03 NOTE — ED Provider Notes (Signed)
  Physical Exam  BP 132/82   Pulse 77   Temp 98 F (36.7 C) (Oral)   Resp 18   Ht 5' 3 (1.6 m)   Wt 117.9 kg   LMP 07/01/2024 (Exact Date)   SpO2 100%   BMI 46.06 kg/m   Physical Exam  Procedures  Procedures  ED Course / MDM    Medical Decision Making Amount and/or Complexity of Data Reviewed Labs: ordered. Radiology: ordered.   I, Larnell Gravely, assumed care for this patient.  In brief, 23 year old female [redacted] weeks pregnant here today with lower abdominal pain.  Patient was signed out pending ultrasound imaging.  Her ultrasound shows a gestational sac without yolk sac or fetal pole.  Her urine does not have any bacteria.  Discussed this with the patient, she will follow-up in 48 hours for repeat hCG, and I informed the patient that she will require ultrasound within 2 weeks.  Patient appropriate for discharge.       Gravely Pac T, DO 08/03/24 1541

## 2024-08-04 ENCOUNTER — Telehealth: Payer: Self-pay | Admitting: Obstetrics & Gynecology

## 2024-08-04 NOTE — Telephone Encounter (Signed)
 Patient called to say she has been trying to reach the [facility] office all week. She was seen in the ED, and need to get an appointment for her HCG check. I called the office, and transferred her to them.

## 2024-08-05 ENCOUNTER — Other Ambulatory Visit: Payer: Self-pay

## 2024-08-05 ENCOUNTER — Ambulatory Visit: Payer: Self-pay

## 2024-08-05 VITALS — BP 112/73 | HR 72 | Ht 63.0 in | Wt 260.4 lb

## 2024-08-05 DIAGNOSIS — O3680X0 Pregnancy with inconclusive fetal viability, not applicable or unspecified: Secondary | ICD-10-CM

## 2024-08-05 LAB — BETA HCG QUANT (REF LAB): hCG Quant: 2528 m[IU]/mL

## 2024-08-05 NOTE — Progress Notes (Signed)
 RN review pt chart and Stat Beta result from 08/05/24 with Dr. Alger who advised pt needs to be scheduled for viability ultrasound in 7-10 days.    08/05/24 12:48 RN called pt to notify her of Stat Beta Result: 2,528 and advise her of provider's recommendation to schedule a follow up viability ultrasound.  Pt verbalized understanding and had no further questions at this time.  Pt confirmed Ultrasound appointment 08/16/24 at 3:15pm.    [place], RN

## 2024-08-05 NOTE — Progress Notes (Signed)
 Pt here today for STAT beta s/p ED f/u on 08/03/24.  Pt reports that she is here to follow up on her pregnancy levels.  Pt denies VB and pain.  Pt advised that the provider will call her today with results and f/u.  Pt verbalized understanding with no further questions.   Hakiem Malizia,RN

## 2024-08-07 ENCOUNTER — Emergency Department (HOSPITAL_BASED_OUTPATIENT_CLINIC_OR_DEPARTMENT_OTHER)

## 2024-08-07 ENCOUNTER — Other Ambulatory Visit: Payer: Self-pay

## 2024-08-07 ENCOUNTER — Emergency Department (HOSPITAL_BASED_OUTPATIENT_CLINIC_OR_DEPARTMENT_OTHER): Admission: EM | Admit: 2024-08-07 | Discharge: 2024-08-07 | Disposition: A | Discharge status: Home / Self Care

## 2024-08-07 ENCOUNTER — Encounter (HOSPITAL_BASED_OUTPATIENT_CLINIC_OR_DEPARTMENT_OTHER): Payer: Self-pay

## 2024-08-07 DIAGNOSIS — R1030 Lower abdominal pain, unspecified: Secondary | ICD-10-CM | POA: Insufficient documentation

## 2024-08-07 DIAGNOSIS — Z3A01 Less than 8 weeks gestation of pregnancy: Secondary | ICD-10-CM | POA: Diagnosis not present

## 2024-08-07 DIAGNOSIS — O3680X0 Pregnancy with inconclusive fetal viability, not applicable or unspecified: Secondary | ICD-10-CM | POA: Insufficient documentation

## 2024-08-07 DIAGNOSIS — J45909 Unspecified asthma, uncomplicated: Secondary | ICD-10-CM | POA: Diagnosis not present

## 2024-08-07 DIAGNOSIS — O26891 Other specified pregnancy related conditions, first trimester: Secondary | ICD-10-CM | POA: Diagnosis present

## 2024-08-07 LAB — CBC WITH DIFFERENTIAL/PLATELET
Abs Immature Granulocytes: 0.02 K/uL (ref 0.00–0.07)
Basophils Absolute: 0 K/uL (ref 0.0–0.1)
Basophils Relative: 0 %
Eosinophils Absolute: 0.1 K/uL (ref 0.0–0.5)
Eosinophils Relative: 2 %
HCT: 35.3 % — ABNORMAL LOW (ref 36.0–46.0)
Hemoglobin: 11.2 g/dL — ABNORMAL LOW (ref 12.0–15.0)
Immature Granulocytes: 0 %
Lymphocytes Relative: 20 %
Lymphs Abs: 1.8 K/uL (ref 0.7–4.0)
MCH: 26 pg (ref 26.0–34.0)
MCHC: 31.7 g/dL (ref 30.0–36.0)
MCV: 81.9 fL (ref 80.0–100.0)
Monocytes Absolute: 0.6 K/uL (ref 0.1–1.0)
Monocytes Relative: 7 %
Neutro Abs: 6.4 K/uL (ref 1.7–7.7)
Neutrophils Relative %: 71 %
Platelets: 408 K/uL — ABNORMAL HIGH (ref 150–400)
RBC: 4.31 MIL/uL (ref 3.87–5.11)
RDW: 15.9 % — ABNORMAL HIGH (ref 11.5–15.5)
WBC: 8.9 K/uL (ref 4.0–10.5)
nRBC: 0 % (ref 0.0–0.2)

## 2024-08-07 LAB — URINALYSIS, ROUTINE W REFLEX MICROSCOPIC
Bilirubin Urine: NEGATIVE
Glucose, UA: NEGATIVE mg/dL
Hgb urine dipstick: NEGATIVE
Ketones, ur: NEGATIVE mg/dL
Leukocytes,Ua: NEGATIVE
Nitrite: NEGATIVE
Protein, ur: NEGATIVE mg/dL
Specific Gravity, Urine: 1.018 (ref 1.005–1.030)
pH: 5.5 (ref 5.0–8.0)

## 2024-08-07 LAB — COMPREHENSIVE METABOLIC PANEL WITH GFR
ALT: 17 U/L (ref 0–44)
AST: 15 U/L (ref 15–41)
Albumin: 3.7 g/dL (ref 3.5–5.0)
Alkaline Phosphatase: 96 U/L (ref 38–126)
Anion gap: 10 (ref 5–15)
BUN: 9 mg/dL (ref 6–20)
CO2: 22 mmol/L (ref 22–32)
Calcium: 9.5 mg/dL (ref 8.9–10.3)
Chloride: 104 mmol/L (ref 98–111)
Creatinine, Ser: 0.56 mg/dL (ref 0.44–1.00)
GFR, Estimated: >60 mL/min (ref >60)
Glucose, Bld: 89 mg/dL (ref 70–99)
Potassium: 3.9 mmol/L (ref 3.5–5.1)
Sodium: 136 mmol/L (ref 135–145)
Total Bilirubin: 0.3 mg/dL (ref 0.0–1.2)
Total Protein: 7.6 g/dL (ref 6.5–8.1)

## 2024-08-07 LAB — HCG, QUANTITATIVE, PREGNANCY: hCG, Beta Chain, Quant, S: 3737 m[IU]/mL — ABNORMAL HIGH (ref <5)

## 2024-08-07 LAB — LIPASE, BLOOD: Lipase: 20 U/L (ref 11–51)

## 2024-08-07 NOTE — ED Triage Notes (Signed)
 Patient reporting 5/10 lower abd pain that started last night. Patient reports she is approximately [redacted] weeks pregnant. Patient was evaluated here on the 8th. Denies N/V/D, hematuria, dysuria, fever, chills, vaginal bleeding.

## 2024-08-07 NOTE — ED Provider Notes (Signed)
 [place] EMERGENCY DEPARTMENT AT [facility] Provider Note   CSN: [id number] Arrival date & time: 08/07/24  9280     Patient presents with: Abdominal Pain   Katherine Escobar is a 23 y.o. female.   23 year old female with past medical history of asthma with recent positive pregnancy test with inconclusive ultrasound presenting to the emergency department today with cramping.  The patient states that she woke up this morning with what she describes as cramping like she has had in the past with her periods.  She denies any vaginal bleeding or discharge.  Denies any severe pain.  States that the pain is actually improved now.  She is here today for further evaluation regarding this.  She was seen here last week for positive pregnancy test and ultrasound was inconclusive.  She followed up with OB and had a rising quantitative hCG.  She is scheduled for a repeat ultrasound on the 21st.  She came to the emergency department today due to the symptoms.  She denies any urinary symptoms, vaginal bleeding, or discharge.   Abdominal Pain      Prior to Admission medications   Medication Sig Start Date End Date Taking? Authorizing Provider  albuterol  (VENTOLIN  HFA) 108 (90 Base) MCG/ACT inhaler Inhale 2 puffs into the lungs every 4 (four) hours as needed for wheezing or shortness of breath. 09/11/23   Piontek, Rocky, MD  benzonatate  (TESSALON ) 200 MG capsule Take 1 capsule (200 mg total) by mouth 3 (three) times daily as needed for cough. 09/11/23   Piontek, Rocky, MD  cephALEXin  (KEFLEX ) 500 MG capsule Take 1 capsule (500 mg total) by mouth 3 (three) times daily. 07/10/24   Rancour, Garnette, MD  hydrOXYzine  (ATARAX ) 25 MG tablet Take 1 tablet (25 mg total) by mouth every 8 (eight) hours as needed for anxiety. 02/23/23   Patsey Lot, MD  metroNIDAZOLE  (FLAGYL ) 500 MG tablet Take 1 tablet (500 mg total) by mouth 2 (two) times daily. 07/10/24   Carita Garnette, MD    Allergies: Lemon  flavoring agent (non-screening), Pineapple, and Strawberry flavoring agent (non-screening)    Review of Systems  Gastrointestinal:  Positive for abdominal pain.  All other systems reviewed and are negative.   Updated Vital Signs BP (!) 120/59 (BP Location: Left Arm)   Pulse 76   Temp 98.4 F (36.9 C) (Oral)   Resp 18   Ht 5' 3 (1.6 m)   Wt 118 kg   LMP 07/01/2024 (Exact Date)   SpO2 100%   BMI 46.08 kg/m   Physical Exam Vitals and nursing note reviewed.   Gen: NAD Eyes: PERRL, EOMI HEENT: no oropharyngeal swelling Neck: trachea midline Resp: clear to auscultation bilaterally Card: RRR, no murmurs, rubs, or gallops Abd: The patient has very mild suprapubic tenderness, no right or left lower quadrant tenderness is noted, no guarding or rebound Extremities: no calf tenderness, no edema Vascular: 2+ radial pulses bilaterally, 2+ DP pulses bilaterally Skin: no rashes Psyc: acting appropriately   (all labs ordered are listed, but only abnormal results are displayed) Labs Reviewed  CBC WITH DIFFERENTIAL/PLATELET - Abnormal; Notable for the following components:      Result Value   Hemoglobin 11.2 (*)    HCT 35.3 (*)    RDW 15.9 (*)    Platelets 408 (*)    All other components within normal limits  HCG, QUANTITATIVE, PREGNANCY - Abnormal; Notable for the following components:   hCG, Beta Chain, Quant, S 3,737 (*)  All other components within normal limits  URINALYSIS, ROUTINE W REFLEX MICROSCOPIC  COMPREHENSIVE METABOLIC PANEL WITH GFR  LIPASE, BLOOD    EKG: None  Radiology: US  OB LESS THAN 14 WEEKS WITH OB TRANSVAGINAL Result Date: 08/07/2024 CLINICAL DATA:  [id number] Pelvic cramping [id number] EXAM: OBSTETRIC <14 WK US  AND TRANSVAGINAL OB US  TECHNIQUE: Both transabdominal and transvaginal ultrasound examinations were performed for complete evaluation of the gestation as well as the maternal uterus, adnexal regions, and pelvic cul-de-sac. Transvaginal technique was  performed to assess early pregnancy. COMPARISON:  August 03, 2024. FINDINGS: Intrauterine gestational sac: Single Yolk sac:  Not Visualized. Embryo:  Not Visualized. MSD: 7.3 mm   5 w   3 d LMP: 07/01/2024.  Gestational age by LMP is 5 weeks 2 days. Subchorionic hemorrhage: None Right ovary: Not visualized transabdominally or transvaginally. Left ovary: Not visualized transabdominally or transvaginally Other :None Free fluid:  None IMPRESSION: Probable early intrauterine gestational sac, but no yolk sac, fetal pole, or cardiac activity yet visualized 4 days after previous ultrasound. Recommend follow-up quantitative B-HCG levels and follow-up US  in 14 days to assess viability. This recommendation follows SRU consensus guidelines: Diagnostic Criteria for Nonviable Pregnancy Early in the First Trimester. LOISE Diedra PARAS Med 2013; 630:8556-48. Electronically Signed   By: Corean Salter M.D.   On: 08/07/2024 09:38     Procedures   Medications Ordered in the ED - No data to display                                  Medical Decision Making 23 year old female with past medical history of asthma and recent positive pregnancy test presenting to the emergency department today with cramping during first trimester.  I will repeat an hCG on the patient here and repeat an ultrasound to evaluate for any findings concerning for ectopic pregnancy such as free fluid.  The patient is well-appearing overall with stable vital signs.  Suspicion for this is relatively low.  I will reevaluate for ultimate disposition.  The patient's labs here are reassuring.  hCG is rising.  The patient does have a yolk sac but no fetal pole.  There is no free fluid or adnexal findings to suggest ectopic pregnancy.  This was discussed with the patient.  She is stable.  Think she can follow-up with OB as an outpatient.  Amount and/or Complexity of Data Reviewed Labs: ordered. Radiology: ordered.        Final diagnoses:  Pregnancy,  location unknown    ED Discharge Orders     None          Ula Prentice SAUNDERS, MD 08/07/24 1001

## 2024-08-07 NOTE — Discharge Instructions (Signed)
 Your ultrasound was stable.  There was still no obvious pregnancy in the uterus but there is likely a very early pregnancy in the uterus.  Please call your OB/GYN on Monday to see if they would like to see you sooner or repeat lab work.  Your hCG levels are increasing as expected.  Take Tylenol  as needed for cramping.  Please return to the emergency department for severe pain, lightheadedness, significant bleeding, or other concerning symptoms.

## 2024-08-16 ENCOUNTER — Other Ambulatory Visit: Payer: Self-pay

## 2024-08-16 ENCOUNTER — Other Ambulatory Visit: Payer: Self-pay | Admitting: Obstetrics and Gynecology

## 2024-08-16 DIAGNOSIS — Z3A01 Less than 8 weeks gestation of pregnancy: Secondary | ICD-10-CM

## 2024-08-16 DIAGNOSIS — O3680X0 Pregnancy with inconclusive fetal viability, not applicable or unspecified: Secondary | ICD-10-CM

## 2024-08-16 DIAGNOSIS — Z3491 Encounter for supervision of normal pregnancy, unspecified, first trimester: Secondary | ICD-10-CM | POA: Diagnosis not present

## 2024-08-17 ENCOUNTER — Other Ambulatory Visit

## 2024-08-17 ENCOUNTER — Telehealth: Payer: Self-pay | Admitting: Family Medicine

## 2024-08-17 NOTE — Telephone Encounter (Signed)
 Patient called to say she had a missed call from the office. She wanted to get her US  results. Patient requesting a call back.

## 2024-08-18 ENCOUNTER — Telehealth: Payer: Self-pay | Admitting: Family Medicine

## 2024-08-18 ENCOUNTER — Other Ambulatory Visit: Payer: Self-pay

## 2024-08-18 NOTE — Telephone Encounter (Signed)
 Patient would like a call back about her US  results so that she can make another appt.

## 2024-08-22 ENCOUNTER — Telehealth: Payer: Self-pay

## 2024-08-22 NOTE — Telephone Encounter (Signed)
 Pt called requesting nurse to go over her ultrasound results from 08/16/24.  RN advised pt of normal US  report stating 6 week 4 days IUP visualized with FHR 130.  Recommended initiating prenatal care around  10 weeks and offered to send list of OBGYN providers in area.  Pt verbalized understanding and states she may go back to [facility].    [place], RN

## 2024-08-22 NOTE — Telephone Encounter (Signed)
 RN spoke to pt about her US  results in another encounter.   Waddell, RN

## 2024-09-03 ENCOUNTER — Emergency Department (HOSPITAL_BASED_OUTPATIENT_CLINIC_OR_DEPARTMENT_OTHER)

## 2024-09-03 ENCOUNTER — Other Ambulatory Visit: Payer: Self-pay

## 2024-09-03 ENCOUNTER — Emergency Department (HOSPITAL_BASED_OUTPATIENT_CLINIC_OR_DEPARTMENT_OTHER)
Admission: EM | Admit: 2024-09-03 | Discharge: 2024-09-03 | Disposition: A | Discharge status: Home / Self Care | Attending: Emergency Medicine | Admitting: Emergency Medicine

## 2024-09-03 ENCOUNTER — Encounter (HOSPITAL_BASED_OUTPATIENT_CLINIC_OR_DEPARTMENT_OTHER): Payer: Self-pay | Admitting: Emergency Medicine

## 2024-09-03 DIAGNOSIS — W2101XA Struck by football, initial encounter: Secondary | ICD-10-CM | POA: Insufficient documentation

## 2024-09-03 DIAGNOSIS — O26891 Other specified pregnancy related conditions, first trimester: Secondary | ICD-10-CM | POA: Insufficient documentation

## 2024-09-03 DIAGNOSIS — O4691 Antepartum hemorrhage, unspecified, first trimester: Secondary | ICD-10-CM | POA: Insufficient documentation

## 2024-09-03 DIAGNOSIS — R109 Unspecified abdominal pain: Secondary | ICD-10-CM | POA: Diagnosis not present

## 2024-09-03 DIAGNOSIS — O9A211 Injury, poisoning and certain other consequences of external causes complicating pregnancy, first trimester: Secondary | ICD-10-CM | POA: Insufficient documentation

## 2024-09-03 DIAGNOSIS — O208 Other hemorrhage in early pregnancy: Secondary | ICD-10-CM

## 2024-09-03 DIAGNOSIS — Z3A09 9 weeks gestation of pregnancy: Secondary | ICD-10-CM | POA: Diagnosis not present

## 2024-09-03 NOTE — Discharge Instructions (Addendum)
###   Subchorionic Hemorrhage Guide     A **subchorionic hemorrhage** is a small area of bleeding between the wall of the uterus and the sac that holds the baby. This is usually found by ultrasound in early pregnancy. Most of the time, these are small and do not cause serious problems. Many women with a small subchorionic hemorrhage go on to have healthy pregnancies.[1][2][3]      **What does this mean for your pregnancy?**      - A small subchorionic hemorrhage may slightly increase the risk of miscarriage or early pregnancy loss, but most women with this finding have normal pregnancies.[1][2]      - The size of the hemorrhage matters: **smaller hemorrhages are less likely to cause problems** than larger ones.[2]      - If you have vaginal bleeding, it is important to let your healthcare provider know, but mild bleeding is common and often resolves on its own.[1]      **Recommended precautions:**      - **Take it easy:** Avoid heavy lifting, strenuous exercise, and sexual intercourse until your provider says it is safe. There is no strong evidence that strict bed rest helps, but resting may help you feel better.[4]    Avoid putting anything in your vagina until you are cleared to do so by an obstetrician    - **Monitor symptoms:** Watch for increased bleeding, severe pain, or passing tissue. If these happen, contact your provider right away.      - **Medications:** Sometimes, your doctor may recommend progesterone treatment, especially if you have a history of miscarriage and early pregnancy bleeding. This can help support the pregnancy in some cases.[4][5]      - **Healthy habits:** Continue taking prenatal vitamins, eat a balanced diet, and stay hydrated.      **Obstetric follow-up:**      - **Regular ultrasounds:** Your provider will likely schedule follow-up ultrasounds to check the size of the hemorrhage and make sure your baby is growing well.[3]      - **Routine prenatal care:**  Keep all scheduled appointments and let your provider know about any new symptoms.      - **Most small subchorionic hemorrhages resolve on their own** and do not require special treatment.[1][2][3]      **When to call your provider:**      - Heavy bleeding (soaking a pad in an hour)      - Severe abdominal pain      - Dizziness or fainting      - Passing tissue      Remember, most women with a small subchorionic hemorrhage have healthy pregnancies. Your healthcare team will monitor you closely and answer any questions you have along the way.[1][2][3]      ### References  1. Association Between First-Trimester Subchorionic Hematomas and Pregnancy Loss in [place] Pregnancies. Naert MN, Khadraoui H, Muniz Evern DELENA Jacobsen M, Fox NS. Obstetrics and Gynecology. 2019;134(2):276-281. doi:10.1097/AOG.9999999999996639. 2. Subchorionic Hemorrhage in First-Trimester Pregnancies: Prediction of Pregnancy Outcome With Sonography. Blair GL, Wyona KATHEE Shirlie FORBES, Benacerraf BR. Radiology. 1996;200(3):803-6. doi:10.1148/radiology.[id number]. 3. Ultrasound-Detected Subchorionic Hemorrhage and the Obstetric Implications. Pasco BLEAK, Odibo AO, Macones GA, et al. Obstetrics and Gynecology. 7989;883(7 Pt 1):311-315. doi:10.1097/AOG.[id number]. 4. Sporadic Miscarriage: Evidence to Provide Effective Care. Coomarasamy A, Gallos ID, Papadopoulou A, et al. Lancet [place], [place]). 2021;397(10285):1668-1674. doi:10.1016/S0140-6736(21)00683-8. 5. A Randomized Trial of Progesterone in Women with Bleeding in Early Pregnancy. Coomarasamy A, Devall AJ, Cheed V, et al. The [place] Journal of Medicine. 2019;380(19):1815-1824. doi:10.1056/NEJMoa1813730.

## 2024-09-03 NOTE — ED Triage Notes (Signed)
 Hit by a thrown football in abdomen around 2pm About 3 months pregnant Mild cramping , now resolve  Some bleeding after wiping, has not needed to wear pads

## 2024-09-03 NOTE — ED Notes (Signed)
 Discharge instructions reviewed with patient. Patient questions answered and opportunity for education reviewed. Patient voices understanding of discharge instructions with no further questions. Patient ambulatory with steady gait to lobby.

## 2024-09-03 NOTE — ED Provider Notes (Signed)
 [place] EMERGENCY DEPARTMENT AT [facility] Provider Note   CSN: [id number] Arrival date & time: 09/03/24  1523     Patient presents with: Vaginal Bleeding   Katherine Escobar is a 23 y.o. female who presents emergency department with chief complaint of injury to her abdomen she is currently [redacted] weeks pregnant.  She states that she was not paying attention and her kids were throwing a football.  Football came straight at her she tried to block with her arm but it hit her right in the belly.  Since that time she has had some abdominal tenderness when she went to the bathroom noted a little bit of blood-tinged spotting on her toilet paper from her vagina so came in for further evaluation.  She denies gush of blood from her vagina.  She has no other symptoms at this time.    Vaginal Bleeding      Prior to Admission medications   Medication Sig Start Date End Date Taking? Authorizing Provider  albuterol  (VENTOLIN  HFA) 108 (90 Base) MCG/ACT inhaler Inhale 2 puffs into the lungs every 4 (four) hours as needed for wheezing or shortness of breath. 09/11/23   Piontek, Rocky, MD  benzonatate  (TESSALON ) 200 MG capsule Take 1 capsule (200 mg total) by mouth 3 (three) times daily as needed for cough. 09/11/23   Piontek, Rocky, MD  cephALEXin  (KEFLEX ) 500 MG capsule Take 1 capsule (500 mg total) by mouth 3 (three) times daily. 07/10/24   Rancour, Garnette, MD  hydrOXYzine  (ATARAX ) 25 MG tablet Take 1 tablet (25 mg total) by mouth every 8 (eight) hours as needed for anxiety. 02/23/23   Patsey Lot, MD  metroNIDAZOLE  (FLAGYL ) 500 MG tablet Take 1 tablet (500 mg total) by mouth 2 (two) times daily. 07/10/24   Carita Garnette, MD    Allergies: Lemon flavoring agent (non-screening), Pineapple, and Strawberry flavoring agent (non-screening)    Review of Systems  Genitourinary:  Positive for vaginal bleeding.    Updated Vital Signs BP 137/77 (BP Location: Right Arm)   Pulse 80   Temp 98.9  F (37.2 C)   Resp 18   LMP 07/01/2024 (Exact Date)   SpO2 93%   Physical Exam Vitals and nursing note reviewed.  Constitutional:      General: She is not in acute distress.    Appearance: She is well-developed. She is not diaphoretic.  HENT:     Head: Normocephalic and atraumatic.     Right Ear: External ear normal.     Left Ear: External ear normal.     Nose: Nose normal.     Mouth/Throat:     Mouth: Mucous membranes are moist.  Eyes:     General: No scleral icterus.    Conjunctiva/sclera: Conjunctivae normal.  Cardiovascular:     Rate and Rhythm: Normal rate and regular rhythm.     Heart sounds: Normal heart sounds. No murmur heard.    No friction rub. No gallop.  Pulmonary:     Effort: Pulmonary effort is normal. No respiratory distress.     Breath sounds: Normal breath sounds.  Abdominal:     General: Bowel sounds are normal. There is no distension.     Palpations: Abdomen is soft. There is no mass.     Tenderness: There is abdominal tenderness. There is no guarding.     Comments: No obvious bruising  Musculoskeletal:     Cervical back: Normal range of motion.  Skin:    General: Skin is warm  and dry.  Neurological:     Mental Status: She is alert and oriented to person, place, and time.  Psychiatric:        Behavior: Behavior normal.     (all labs ordered are listed, but only abnormal results are displayed) Labs Reviewed - No data to display  EKG: None  Radiology: No results found.   Procedures   Medications Ordered in the ED - No data to display  Clinical Course as of 09/03/24 1855  Sat Sep 03, 2024  1846 US  OB LESS THAN 14 WEEKS WITH OB TRANSVAGINAL I visualized and interpreted ultrasound of the pelvis which shows small subchorionic hemorrhage [AH]    Clinical Course User Index [AH] Arloa Chroman, PA-C                                 Medical Decision Making Amount and/or Complexity of Data Reviewed Radiology: ordered. Decision-making  details documented in ED Course.   Patient here with complaint of spotting in early pregnancy The differential diagnosis for vaginal bleeding in pregnancy less than 20 weeks includes but is not limited to the following: Ectopic pregnancy, Subchorionic hematoma, First Trimester Abortion, Gestational trophoblastic disease, Heterotopic pregnancy, Implantation bleeding, Molar pregnancy, Cervicitis, Fibroids, Vaginal Trauma  After review of patient's ultrasound she has a very small subchorionic hemorrhage.  I visualized and personally interpreted these images.  Patient be discharged with pelvic rest precautions.  Follow-up with OB and strict return precautions.     Final diagnoses:  None    ED Discharge Orders     None          Arloa Chroman, PA-C 09/03/24 1856    Emil Share, DO 09/03/24 1948

## 2024-09-03 NOTE — ED Notes (Signed)
 Pt taken to [place]

## 2024-09-27 ENCOUNTER — Ambulatory Visit: Admitting: Obstetrics & Gynecology

## 2024-09-27 ENCOUNTER — Other Ambulatory Visit (HOSPITAL_COMMUNITY)
Admission: RE | Admit: 2024-09-27 | Discharge: 2024-09-27 | Disposition: A | Discharge status: Home / Self Care | Source: Ambulatory Visit | Attending: Obstetrics & Gynecology | Admitting: Obstetrics & Gynecology

## 2024-09-27 ENCOUNTER — Encounter: Payer: Self-pay | Admitting: Obstetrics & Gynecology

## 2024-09-27 VITALS — BP 126/90 | HR 74 | Wt 268.0 lb

## 2024-09-27 DIAGNOSIS — O219 Vomiting of pregnancy, unspecified: Secondary | ICD-10-CM

## 2024-09-27 DIAGNOSIS — O26891 Other specified pregnancy related conditions, first trimester: Secondary | ICD-10-CM

## 2024-09-27 DIAGNOSIS — O99211 Obesity complicating pregnancy, first trimester: Secondary | ICD-10-CM

## 2024-09-27 DIAGNOSIS — N898 Other specified noninflammatory disorders of vagina: Secondary | ICD-10-CM | POA: Diagnosis not present

## 2024-09-27 DIAGNOSIS — Z3401 Encounter for supervision of normal first pregnancy, first trimester: Secondary | ICD-10-CM

## 2024-09-27 DIAGNOSIS — Z3A12 12 weeks gestation of pregnancy: Secondary | ICD-10-CM

## 2024-09-27 DIAGNOSIS — Z3490 Encounter for supervision of normal pregnancy, unspecified, unspecified trimester: Secondary | ICD-10-CM | POA: Insufficient documentation

## 2024-09-27 MED ORDER — ONDANSETRON 4 MG PO TBDP: 4.0000 mg | ORAL_TABLET | Freq: Four times a day (QID) | ORAL | 2 refills | Status: DC | PRN

## 2024-09-27 MED ORDER — ASPIRIN 81 MG PO TBEC: 81.0000 mg | DELAYED_RELEASE_TABLET | Freq: Every day | ORAL | 2 refills | Status: AC

## 2024-09-27 NOTE — Progress Notes (Signed)
 INITIAL PRENATAL VISIT  History:  Katherine Escobar is a 22 y.o. G1P0000 at [redacted]w[redacted]d by LMP, early ultrasound being seen today for her first obstetrical visit.   Was unable to have a new OB intake visit.  Patient reports nausea and vomiting, desires medication.  HISTORY: OB History  Gravida Para Term Preterm AB Living  1 0 0 0 0 0  SAB IAB Ectopic Multiple Live Births  0 0 0 0 0    # Outcome Date GA Lbr Len/2nd Weight Sex Type Anes PTL Lv  1 Current           Never had a pap.  Past Medical History:  Diagnosis Date   Asthma    Chlamydia 02/12/2021   History reviewed. No pertinent surgical history. Family History  Problem Relation Age of Onset   Hypertension Mother    Hyperlipidemia Mother    Arrhythmia Mother    Social History   Tobacco Use   Smoking status: Every Day    Types: Cigars    Passive exposure: Never   Smokeless tobacco: Never  Vaping Use   Vaping status: Never Used  Substance Use Topics   Alcohol use: Not Currently    Comment: occasional   Drug use: Not Currently   Allergies  Allergen Reactions   Lemon Flavoring Agent (Non-Screening)    Pineapple    Strawberry Flavoring Agent (Non-Screening)    Current Outpatient Medications on File Prior to Visit  Medication Sig Dispense Refill   albuterol  (VENTOLIN  HFA) 108 (90 Base) MCG/ACT inhaler Inhale 2 puffs into the lungs every 4 (four) hours as needed for wheezing or shortness of breath. (Patient not taking: Reported on 09/27/2024) 1 each 0   benzonatate  (TESSALON ) 200 MG capsule Take 1 capsule (200 mg total) by mouth 3 (three) times daily as needed for cough. (Patient not taking: Reported on 09/27/2024) 21 capsule 0   cephALEXin  (KEFLEX ) 500 MG capsule Take 1 capsule (500 mg total) by mouth 3 (three) times daily. (Patient not taking: Reported on 09/27/2024) 21 capsule 0   hydrOXYzine  (ATARAX ) 25 MG tablet Take 1 tablet (25 mg total) by mouth every 8 (eight) hours as needed for anxiety. (Patient not taking:  Reported on 09/27/2024) 12 tablet 0   metroNIDAZOLE  (FLAGYL ) 500 MG tablet Take 1 tablet (500 mg total) by mouth 2 (two) times daily. (Patient not taking: Reported on 09/27/2024) 14 tablet 0   No current facility-administered medications on file prior to visit.    Review of Systems Pertinent items noted in HPI and remainder of comprehensive ROS otherwise negative.  Indications for ASA therapy (per UpToDate) Two or more of the following: (Can do 81 mg daily) Nulliparity Yes Obesity (body mass index >30 kg/m2) Yes Sociodemographic characteristics (African American race, low socioeconomic level) Yes   Physical Exam:   Vitals:   09/27/24 1530  BP: (!) 126/90  Pulse: 74  Weight: 268 lb (121.6 kg)   Fetal Heart Rate (bpm): 156    General: well-developed, well-nourished female in no acute distress  Breasts:  normal appearance, no masses or tenderness bilaterally, exam done in the presence of a chaperone.   Skin: normal coloration and turgor, no rashes  Neurologic: oriented, normal, negative, normal mood  Extremities: normal strength, tone, and muscle mass, ROM of all joints is normal  HEENT PERRLA, extraocular movement intact and sclera clear, anicteric  Neck supple and no masses  Cardiovascular: regular rate and rhythm  Respiratory:  no respiratory distress, normal breath sounds  Abdomen: soft, non-tender; bowel sounds normal; no masses,  no organomegaly  Pelvic: normal external genitalia, no lesions, normal vaginal mucosa, copious white vaginal discharge seen and testing sample seen, normal cervix, pap smear done. Exam done in the presence of a chaperone.    Assessment:  Pregnancy: G1P0000 Patient Active Problem List   Diagnosis Date Noted   Supervision of normal pregnancy 09/27/2024   Maternal morbid obesity, antepartum (HCC) 09/27/2024    Plan:  1. Nausea/vomiting in pregnancy Zofran  prescribed. - ondansetron  (ZOFRAN -ODT) 4 MG disintegrating tablet; Take 1 tablet (4 mg  total) by mouth every 6 (six) hours as needed for nausea.  Dispense: 20 tablet; Refill: 2  2. Vaginal discharge during pregnancy in first trimester - Cervicovaginal ancillary done, will follow up results and manage accordingly.  3. Maternal morbid obesity, antepartum (HCC) Recommended TWG 11-20 lbs. Aspirin prescribed for preeclampsia prophylaxis. - Hemoglobin A1c - TSH Rfx on Abnormal to Free T4 - US  MFM OB DETAIL +14 WK; Future - aspirin EC 81 MG tablet; Take 1 tablet (81 mg total) by mouth at bedtime. Start taking when you are [redacted] weeks pregnant for rest of pregnancy for prevention of preeclampsia  Dispense: 300 tablet; Refill: 2  4. [redacted] weeks gestation of pregnancy 5. Encounter for supervision of normal first pregnancy in first trimester (Primary) - CBC/D/Plt+RPR+Rh+ABO+RubIgG... - Hemoglobin A1c - Comprehensive metabolic panel with GFR - Culture, OB Urine - PANORAMA PRENATAL TEST - HORIZON CUSTOM - Cytology - PAP - US  MFM OB DETAIL +14 WK; Future - Enroll Patient in PreNatal Babyscripts - Babyscripts Schedule Optimization   Initial labs drawn. Continue prenatal vitamins. Concerned about DBP today of 90, will continue to monitor. Problem list reviewed and updated. Genetic Screening discussed, Panorama and Horizon: ordered. Ultrasound discussed; fetal anatomic survey: planned. Anticipatory guidance about prenatal visits given including labs, ultrasounds, and testing. Weight gain recommendations per IOM guidelines reviewed: underweight/BMI 18.5 or less > 28 - 40 lbs; normal weight/BMI 18.5 - 24.9 > 25 - 35 lbs; overweight/BMI 25 - 29.9 > 15 - 25 lbs; obese/BMI 30 or more > 11 - 20 lbs. Discussed usage of the Babyscripts app for more information about pregnancy, and to track blood pressures. Also discussed usage of virtual visits as additional source of managing and completing prenatal visits.  Patient was encouraged to use MyChart to review results, send requests, and have  questions addressed.   The nature of [facility]Faculty Practice with multiple MDs and Advanced Practice Providers was explained to patient; also emphasized that residents, students are part of our team. Routine obstetric precautions reviewed. Encouraged to seek out care at our office or emergency room [facility] MAU preferred) for urgent and/or emergent concerns. Return in about 4 weeks (around 10/25/2024) for OFFICE OB VISIT (MD or APP).     GLORIS HUGGER, MD, FACOG Obstetrician & Gynecologist, [facility] [employer], [facility]

## 2024-09-29 ENCOUNTER — Ambulatory Visit: Payer: Self-pay | Admitting: Obstetrics & Gynecology

## 2024-09-29 DIAGNOSIS — Z3401 Encounter for supervision of normal first pregnancy, first trimester: Secondary | ICD-10-CM

## 2024-09-29 DIAGNOSIS — O23599 Infection of other part of genital tract in pregnancy, unspecified trimester: Secondary | ICD-10-CM

## 2024-09-29 LAB — CBC/D/PLT+RPR+RH+ABO+RUBIGG...
Antibody Screen: NEGATIVE
Basophils Absolute: 0 x10E3/uL (ref 0.0–0.2)
Basos: 0 %
EOS (ABSOLUTE): 0.1 x10E3/uL (ref 0.0–0.4)
Eos: 1 %
HCV Ab: NONREACTIVE
HIV Screen 4th Generation wRfx: NONREACTIVE
Hematocrit: 38.8 % (ref 34.0–46.6)
Hemoglobin: 12.4 g/dL (ref 11.1–15.9)
Hepatitis B Surface Ag: NEGATIVE
Immature Grans (Abs): 0 x10E3/uL (ref 0.0–0.1)
Immature Granulocytes: 0 %
Lymphocytes Absolute: 2.2 x10E3/uL (ref 0.7–3.1)
Lymphs: 26 %
MCH: 26.6 pg (ref 26.6–33.0)
MCHC: 32 g/dL (ref 31.5–35.7)
MCV: 83 fL (ref 79–97)
Monocytes Absolute: 0.6 x10E3/uL (ref 0.1–0.9)
Monocytes: 7 %
Neutrophils Absolute: 5.4 x10E3/uL (ref 1.4–7.0)
Neutrophils: 66 %
Platelets: 371 x10E3/uL (ref 150–450)
RBC: 4.67 x10E6/uL (ref 3.77–5.28)
RDW: 14.6 % (ref 11.7–15.4)
RPR Ser Ql: NONREACTIVE
Rh Factor: POSITIVE
Rubella Antibodies, IGG: 1.2 {index} (ref >0.99)
WBC: 8.2 x10E3/uL (ref 3.4–10.8)

## 2024-09-29 LAB — CULTURE, OB URINE

## 2024-09-29 LAB — TSH RFX ON ABNORMAL TO FREE T4: TSH: 0.361 u[IU]/mL — ABNORMAL LOW (ref 0.450–4.500)

## 2024-09-29 LAB — COMPREHENSIVE METABOLIC PANEL WITH GFR
ALT: 13 IU/L (ref 0–32)
AST: 13 IU/L (ref 0–40)
Albumin: 4.2 g/dL (ref 4.0–5.0)
Alkaline Phosphatase: 88 IU/L (ref 41–116)
BUN/Creatinine Ratio: 8 — ABNORMAL LOW (ref 9–23)
BUN: 4 mg/dL — ABNORMAL LOW (ref 6–20)
Bilirubin Total: 0.3 mg/dL (ref 0.0–1.2)
CO2: 17 mmol/L — ABNORMAL LOW (ref 20–29)
Calcium: 10 mg/dL (ref 8.7–10.2)
Chloride: 100 mmol/L (ref 96–106)
Creatinine, Ser: 0.53 mg/dL — ABNORMAL LOW (ref 0.57–1.00)
Globulin, Total: 4 g/dL (ref 1.5–4.5)
Glucose: 77 mg/dL (ref 70–99)
Potassium: 3.7 mmol/L (ref 3.5–5.2)
Sodium: 136 mmol/L (ref 134–144)
Total Protein: 8.2 g/dL (ref 6.0–8.5)
eGFR: 133 mL/min/1.73 (ref >59)

## 2024-09-29 LAB — HEMOGLOBIN A1C
Est. average glucose Bld gHb Est-mCnc: 114 mg/dL
Hgb A1c MFr Bld: 5.6 % (ref 4.8–5.6)

## 2024-09-29 LAB — T4F: T4,Free (Direct): 1.02 ng/dL (ref 0.82–1.77)

## 2024-09-29 LAB — HCV INTERPRETATION

## 2024-09-29 LAB — URINE CULTURE, OB REFLEX

## 2024-09-30 LAB — CYTOLOGY - PAP
Chlamydia: NEGATIVE
Comment: NEGATIVE
Comment: NEGATIVE
Comment: NEGATIVE
Comment: NORMAL
Diagnosis: NEGATIVE
High risk HPV: NEGATIVE
Neisseria Gonorrhea: NEGATIVE
Trichomonas: NEGATIVE

## 2024-10-01 ENCOUNTER — Other Ambulatory Visit: Payer: Self-pay

## 2024-10-01 ENCOUNTER — Inpatient Hospital Stay (HOSPITAL_COMMUNITY)
Admission: AD | Admit: 2024-10-01 | Discharge: 2024-10-01 | Disposition: A | Discharge status: Home / Self Care | Attending: Obstetrics and Gynecology | Admitting: Obstetrics and Gynecology

## 2024-10-01 DIAGNOSIS — R519 Headache, unspecified: Secondary | ICD-10-CM

## 2024-10-01 DIAGNOSIS — Z3A13 13 weeks gestation of pregnancy: Secondary | ICD-10-CM | POA: Diagnosis not present

## 2024-10-01 DIAGNOSIS — O26892 Other specified pregnancy related conditions, second trimester: Secondary | ICD-10-CM

## 2024-10-01 MED ORDER — ACETAMINOPHEN-CAFFEINE 500-65 MG PO TABS
2.0000 | ORAL_TABLET | Freq: Once | ORAL | Status: AC
  Administered 2024-10-01: 2 via ORAL
  Filled 2024-10-01: qty 2

## 2024-10-01 NOTE — MAU Note (Signed)
 Katherine Escobar is a 23 y.o. at [redacted]w[redacted]d here in MAU reporting: headache that started 1.5 hours ago. Has not taken anything for the headache. Was at work when it started. Denies any N/V/D. Denies any changes in vision. States she has not had a headache in 6-7 months. Patient drove herself here.  Onset of complaint: 1.5 hours ago Pain score: 9 Vitals:   10/01/24 1715  BP: 125/67  Pulse: 66  Resp: 15  Temp: 97.7 F (36.5 C)  SpO2: 100%     FHT:170 Lab orders placed from triage:

## 2024-10-01 NOTE — MAU Provider Note (Signed)
 History     CSN: [id number]  Arrival date and time: 10/01/24 1651   Event Date/Time   First Provider Initiated Contact with Patient 10/01/24 1750      Chief Complaint  Patient presents with   Headache   Abdominal Pain   Katherine Escobar , a  23 y.o. G1P0000 at [redacted]w[redacted]d presents to MAU with complaints of a headache that started about 1.5 hours ago. States the pain is located on the front and currently rates as a 9/10. She denies attempting to relieve symptoms. She denies visual disturbances feeling lightheaded or dizzy. She reports eating today and drinking water. Reports abdominal pain earlier but not at this time. She has no other current complaints.        Headache  Associated symptoms include abdominal pain. Pertinent negatives include no back pain, eye pain, fever, nausea, seizures, vomiting or weakness.  Abdominal Pain Associated symptoms include headaches. Pertinent negatives include no constipation, diarrhea, dysuria, fever, nausea or vomiting.    OB History     Gravida  1   Para  0   Term  0   Preterm  0   AB  0   Living  0      SAB  0   IAB  0   Ectopic  0   Multiple  0   Live Births  0           Past Medical History:  Diagnosis Date   Asthma    Chlamydia 02/12/2021    No past surgical history on file.  Family History  Problem Relation Age of Onset   Hypertension Mother    Hyperlipidemia Mother    Arrhythmia Mother     Social History   Tobacco Use   Smoking status: Every Day    Types: Cigars    Passive exposure: Never   Smokeless tobacco: Never  Vaping Use   Vaping status: Never Used  Substance Use Topics   Alcohol use: Not Currently    Comment: occasional   Drug use: Not Currently    Allergies:  Allergies  Allergen Reactions   Lemon Flavoring Agent (Non-Screening)    Pineapple    Strawberry Flavoring Agent (Non-Screening)     Medications Prior to Admission  Medication Sig Dispense Refill Last Dose/Taking   aspirin   EC 81 MG tablet Take 1 tablet (81 mg total) by mouth at bedtime. Start taking when you are [redacted] weeks pregnant for rest of pregnancy for prevention of preeclampsia 300 tablet 2 10/01/2024   ondansetron  (ZOFRAN -ODT) 4 MG disintegrating tablet Take 1 tablet (4 mg total) by mouth every 6 (six) hours as needed for nausea. 20 tablet 2 10/01/2024   albuterol  (VENTOLIN  HFA) 108 (90 Base) MCG/ACT inhaler Inhale 2 puffs into the lungs every 4 (four) hours as needed for wheezing or shortness of breath. (Patient not taking: Reported on 09/27/2024) 1 each 0    benzonatate  (TESSALON ) 200 MG capsule Take 1 capsule (200 mg total) by mouth 3 (three) times daily as needed for cough. (Patient not taking: Reported on 09/27/2024) 21 capsule 0    cephALEXin  (KEFLEX ) 500 MG capsule Take 1 capsule (500 mg total) by mouth 3 (three) times daily. (Patient not taking: Reported on 09/27/2024) 21 capsule 0    hydrOXYzine  (ATARAX ) 25 MG tablet Take 1 tablet (25 mg total) by mouth every 8 (eight) hours as needed for anxiety. (Patient not taking: Reported on 09/27/2024) 12 tablet 0    metroNIDAZOLE  (FLAGYL ) 500 MG tablet  Take 1 tablet (500 mg total) by mouth 2 (two) times daily. (Patient not taking: Reported on 09/27/2024) 14 tablet 0     Review of Systems  Constitutional:  Negative for chills, fatigue and fever.  Eyes:  Negative for pain and visual disturbance.  Respiratory:  Negative for apnea, shortness of breath and wheezing.   Cardiovascular:  Negative for chest pain and palpitations.  Gastrointestinal:  Positive for abdominal pain. Negative for constipation, diarrhea, nausea and vomiting.  Genitourinary:  Negative for difficulty urinating, dysuria, pelvic pain, vaginal bleeding, vaginal discharge and vaginal pain.  Musculoskeletal:  Negative for back pain.  Neurological:  Positive for headaches. Negative for seizures and weakness.  Psychiatric/Behavioral:  Negative for suicidal ideas.    Physical Exam   Blood pressure 125/67,  pulse 66, temperature 97.7 F (36.5 C), temperature source Oral, resp. rate 15, height 5' 4 (1.626 m), weight 120.4 kg, last menstrual period 07/01/2024, SpO2 100%.  Physical Exam Vitals and nursing note reviewed.  Constitutional:      General: She is not in acute distress.    Appearance: Normal appearance.  HENT:     Head: Normocephalic.  Pulmonary:     Effort: Pulmonary effort is normal.  Abdominal:     Palpations: Abdomen is soft.     Tenderness: There is no abdominal tenderness.  Musculoskeletal:     Cervical back: Normal range of motion.  Skin:    General: Skin is warm and dry.  Neurological:     Mental Status: She is alert and oriented to person, place, and time.     GCS: GCS eye subscore is 4. GCS verbal subscore is 5. GCS motor subscore is 6.  Psychiatric:        Mood and Affect: Mood normal.     MAU provider was requested by RN to verify viability.  Patient informed that the ultrasound is considered a limited OB ultrasound and is not intended to be a complete ultrasound exam.  Patient also informed that the ultrasound is not being completed with the intent of assessing for fetal or placental anomalies or any pelvic abnormalities.  Explained that the purpose of today's ultrasound is to assess for viability.  Patient acknowledges the purpose of the exam and the limitations of the study.  FHR on US  today was 170bpm    Claris CHRISTELLA Cedar, CNM  10/01/2024 5:53 PM   MAU Course  Procedures Meds ordered this encounter  Medications   acetaminophen -caffeine  (EXCEDRIN TENSION HEADACHE) 500-65 MG per tablet 2 tablet    MDM - Patient is driving herself.  - PO Excedrin ordered and given with a caffeinated soda.  - Reassessment @ 1846 and headache now 0/10.  - plan for discharge.   Assessment and Plan   1. Pregnancy headache in second trimester   2. [redacted] weeks gestation of pregnancy    - Reviewed things that can cause headache in pregnancy, dehydration, lack of sleep or  nutrition etc.  - Reviewed OTC options that are safe in pregnancy.  - Reviewed worsening signs and return precautions.  - Patient discharged home in stable condition and may return to MAU as needed.   Claris CHRISTELLA Cedar, MSN CNM  10/01/2024, 5:50 PM

## 2024-10-03 LAB — CERVICOVAGINAL ANCILLARY ONLY
Bacterial Vaginitis (gardnerella): POSITIVE — AB
Candida Glabrata: NEGATIVE
Candida Vaginitis: NEGATIVE
Comment: NEGATIVE
Comment: NEGATIVE
Comment: NEGATIVE

## 2024-10-03 LAB — PANORAMA PRENATAL TEST FULL PANEL:PANORAMA TEST PLUS 5 ADDITIONAL MICRODELETIONS: FETAL FRACTION: 4.3

## 2024-10-04 MED ORDER — METRONIDAZOLE 500 MG PO TABS: 500.0000 mg | ORAL_TABLET | Freq: Two times a day (BID) | ORAL | 0 refills | Status: DC

## 2024-10-05 LAB — HORIZON CUSTOM: REPORT SUMMARY: NEGATIVE

## 2024-10-21 ENCOUNTER — Other Ambulatory Visit: Payer: Self-pay

## 2024-10-21 DIAGNOSIS — O219 Vomiting of pregnancy, unspecified: Secondary | ICD-10-CM

## 2024-10-21 MED ORDER — ONDANSETRON 4 MG PO TBDP: 4.0000 mg | ORAL_TABLET | Freq: Four times a day (QID) | ORAL | 2 refills | Status: DC | PRN

## 2024-10-21 NOTE — Progress Notes (Signed)
 Pt called stating she is out of zofran  and her nausea is bad, therefore per patients request refill of zofran  sent into pharmacy

## 2024-10-25 ENCOUNTER — Telehealth: Admitting: Family Medicine

## 2024-10-25 VITALS — BP 133/83 | HR 84 | Wt 269.4 lb

## 2024-10-25 DIAGNOSIS — O99612 Diseases of the digestive system complicating pregnancy, second trimester: Secondary | ICD-10-CM | POA: Diagnosis not present

## 2024-10-25 DIAGNOSIS — K5903 Drug induced constipation: Secondary | ICD-10-CM | POA: Diagnosis not present

## 2024-10-25 DIAGNOSIS — Z3A16 16 weeks gestation of pregnancy: Secondary | ICD-10-CM

## 2024-10-25 DIAGNOSIS — Z3402 Encounter for supervision of normal first pregnancy, second trimester: Secondary | ICD-10-CM

## 2024-10-25 MED ORDER — DOCUSATE SODIUM 100 MG PO CAPS: 100.0000 mg | ORAL_CAPSULE | Freq: Two times a day (BID) | ORAL | 2 refills | Status: DC | PRN

## 2024-10-25 MED ORDER — POLYETHYLENE GLYCOL 3350 17 GM/SCOOP PO POWD: 17.0000 g | Freq: Every day | ORAL | 3 refills | Status: AC

## 2024-10-25 NOTE — Progress Notes (Signed)
" ° °  OBSTETRICS PRENATAL VIRTUAL VISIT ENCOUNTER NOTE  Provider location: [facility]   Patient location: [facility]  I connected with Katherine Escobar on 10/25/2024 at  2:50 PM EST by MyChart Video Encounter and verified that I am speaking with the correct person using two identifiers. I discussed the limitations, risks, security and privacy concerns of performing an evaluation and management service virtually and the availability of in person appointments. I also discussed with the patient that there may be a patient responsible charge related to this service. The patient expressed understanding and agreed to proceed. Subjective:  Katherine Escobar is a 23 y.o. G1P0000 at [redacted]w[redacted]d being seen today for ongoing prenatal care.  She is currently monitored for the following issues for this low-risk pregnancy and has Supervision of normal pregnancy and Maternal morbid obesity, antepartum (HCC) on their problem list.  Patient reports contractions.  Contractions: Not present. Vag. Bleeding: None.  Movement: Present. Denies any leaking of fluid.   The following portions of the patient's history were reviewed and updated as appropriate: allergies, current medications, past family history, past medical history, past social history, past surgical history and problem list.   Objective:    Vitals:   10/25/24 1506  BP: 133/83  Pulse: 84  Weight: 269 lb 6.4 oz (122.2 kg)    Fetal Status:  Fetal Heart Rate (bpm): 152   Movement: Present    General: Alert, oriented and cooperative. Patient is in no acute distress.  Respiratory: Normal respiratory effort, no problems with respiration noted  Mental Status: Normal mood and affect. Normal behavior. Normal judgment and thought content.  Rest of physical exam deferred due to type of encounter  Imaging: No results found.  Assessment and Plan:  Pregnancy: G1P0000 at [redacted]w[redacted]d 1. [redacted] weeks gestation of pregnancy (Primary) AFP today - AFP, Serum, Open Spina Bifida  2.  Encounter for supervision of normal first pregnancy in second trimester Continue prenatal care.  3. Drug-induced constipation Exacerbated by pregnancy and Zofran  Trial of colace and Miralax. - docusate sodium (COLACE) 100 MG capsule; Take 1 capsule (100 mg total) by mouth 2 (two) times daily as needed.  Dispense: 30 capsule; Refill: 2 - polyethylene glycol powder (MIRALAX) 17 GM/SCOOP powder; Take 17 g by mouth daily. Dissolve 1 capful (17g) in 4-8 ounces of liquid and take by mouth daily.  Dispense: 238 g; Refill: 3  General obstetric precautions including but not limited to vaginal bleeding, contractions, leaking of fluid and fetal movement were reviewed in detail with the patient. I discussed the assessment and treatment plan with the patient. The patient was provided an opportunity to ask questions and all were answered. The patient agreed with the plan and demonstrated an understanding of the instructions. The patient was advised to call back or seek an in-person office evaluation/go to MAU at [facility] for any urgent or concerning symptoms. Please refer to After Visit Summary for other counseling recommendations.   I provided 8 minutes of face-to-face time during this encounter.  Return in 4 weeks (on 11/22/2024).  Future Appointments  Date Time Provider Department Center  11/22/2024  8:00 AM [facility] PROVIDER 1 [facility]  11/22/2024  8:30 AM [facility] US3 [facility]  11/22/2024  4:10 PM Fredirick Glenys RAMAN, MD [facility]  12/13/2024 11:15 AM Anyanwu, Gloris LABOR, MD [facility]    Glenys RAMAN Fredirick, MD Center for [facility], [facility]  "

## 2024-10-26 LAB — AFP, SERUM, OPEN SPINA BIFIDA
AFP MoM: 1.53
AFP Value: 39.3 ng/mL
Gest. Age on Collection Date: 16 wk
Maternal Age At EDD: 24.4 a
OSBR Risk 1 IN: 5046
Test Results:: NEGATIVE
Weight: 269 [lb_av]

## 2024-10-28 ENCOUNTER — Ambulatory Visit: Payer: Self-pay | Admitting: Family Medicine

## 2024-10-28 DIAGNOSIS — Z3402 Encounter for supervision of normal first pregnancy, second trimester: Secondary | ICD-10-CM

## 2024-11-14 ENCOUNTER — Inpatient Hospital Stay (HOSPITAL_COMMUNITY)
Admission: AD | Admit: 2024-11-14 | Discharge: 2024-11-14 | Disposition: A | Discharge status: Home / Self Care | Attending: Obstetrics and Gynecology | Admitting: Obstetrics and Gynecology

## 2024-11-14 ENCOUNTER — Other Ambulatory Visit: Payer: Self-pay

## 2024-11-14 DIAGNOSIS — Z3A19 19 weeks gestation of pregnancy: Secondary | ICD-10-CM | POA: Diagnosis not present

## 2024-11-14 DIAGNOSIS — O99212 Obesity complicating pregnancy, second trimester: Secondary | ICD-10-CM | POA: Diagnosis present

## 2024-11-14 DIAGNOSIS — K5903 Drug induced constipation: Secondary | ICD-10-CM | POA: Diagnosis not present

## 2024-11-14 DIAGNOSIS — K5901 Slow transit constipation: Secondary | ICD-10-CM | POA: Diagnosis not present

## 2024-11-14 DIAGNOSIS — O99612 Diseases of the digestive system complicating pregnancy, second trimester: Secondary | ICD-10-CM | POA: Diagnosis not present

## 2024-11-14 DIAGNOSIS — O26892 Other specified pregnancy related conditions, second trimester: Secondary | ICD-10-CM | POA: Diagnosis not present

## 2024-11-14 DIAGNOSIS — Z3402 Encounter for supervision of normal first pregnancy, second trimester: Secondary | ICD-10-CM

## 2024-11-14 LAB — URINALYSIS, ROUTINE W REFLEX MICROSCOPIC
Bilirubin Urine: NEGATIVE
Glucose, UA: NEGATIVE mg/dL
Hgb urine dipstick: NEGATIVE
Ketones, ur: NEGATIVE mg/dL
Nitrite: NEGATIVE
Protein, ur: NEGATIVE mg/dL
Specific Gravity, Urine: 1.023 (ref 1.005–1.030)
pH: 6 (ref 5.0–8.0)

## 2024-11-14 LAB — WET PREP, GENITAL
Clue Cells Wet Prep HPF POC: NONE SEEN
Sperm: NONE SEEN
Trich, Wet Prep: NONE SEEN
WBC, Wet Prep HPF POC: >=10 — AB
Yeast Wet Prep HPF POC: NONE SEEN

## 2024-11-14 MED ORDER — ALBUTEROL SULFATE HFA 108 (90 BASE) MCG/ACT IN AERS: 2.0000 | INHALATION_SPRAY | RESPIRATORY_TRACT | 0 refills | Status: AC | PRN

## 2024-11-14 MED ORDER — DOCUSATE SODIUM 100 MG PO CAPS: 100.0000 mg | ORAL_CAPSULE | Freq: Two times a day (BID) | ORAL | 2 refills | Status: AC | PRN

## 2024-11-14 NOTE — MAU Note (Signed)
 Katherine Escobar is a 24 y.o. at [redacted]w[redacted]d here in MAU reporting: she's having cramping in the LLQ that began this morning.  Denies VB and LOF.  Denies taking meds to treat pain.  LMP: 07/01/2024 Onset of complaint: today Pain score: 8 Vitals:   11/14/24 1558  BP: 118/65  Pulse: 74  Resp: 18  Temp: 98.3 F (36.8 C)  SpO2: 100%     FHT: 159 bpm  Lab orders placed from triage: UA

## 2024-11-14 NOTE — MAU Provider Note (Signed)
 Chief Complaint:  Abdominal Pain   HPI   None     Katherine Escobar is a 24 y.o. G1P0000 at [redacted]w[redacted]d who presents to maternity admissions reporting LLQ pain that started this morning. Denies other symptoms at this time. Was previously taking Miralax  last week but taking it every other day and was not getting relief. Is unable to recall her last bowel movement, knows that it has been multiple days. States that she eats everything, but isn't eating leafy green vegetables or foods high in fiber per her knowledge.   Pregnancy Course: Follows with [facility] for prenatal care.  Past Medical History:  Diagnosis Date   Asthma    Chlamydia 02/12/2021   OB History  Gravida Para Term Preterm AB Living  1 0 0 0 0 0  SAB IAB Ectopic Multiple Live Births  0 0 0 0 0    # Outcome Date GA Lbr Len/2nd Weight Sex Type Anes PTL Lv  1 Current            No past surgical history on file. Family History  Problem Relation Age of Onset   Hypertension Mother    Hyperlipidemia Mother    Arrhythmia Mother    Social History[1] Allergies[2] No medications prior to admission.    I have reviewed patient's Past Medical Hx, Surgical Hx, Family Hx, Social Hx, medications and allergies.   ROS  Pertinent items noted in HPI and remainder of comprehensive ROS otherwise negative.   PHYSICAL EXAM  Patient Vitals for the past 24 hrs:  BP Temp Temp src Pulse Resp SpO2 Height Weight  11/14/24 1558 118/65 98.3 F (36.8 C) Oral 74 18 100 % -- --  11/14/24 1553 -- -- -- -- -- -- 5' 3 (1.6 m) 118.9 kg    Constitutional: Well-developed, well-nourished female in no acute distress.  Cardiovascular: normal rate & rhythm, warm and well-perfused Respiratory: normal effort, no problems with respiration noted GI: Abd soft, tender to palpation in LLQ, non-radiating, non-distended MS: Extremities nontender, no edema, normal ROM Neurologic: Alert and oriented x 4.        Labs: Results for orders placed or  performed during the hospital encounter of 11/14/24 (from the past 24 hours)  Urinalysis, Routine w reflex microscopic -Urine, Clean Catch     Status: Abnormal   Collection Time: 11/14/24  4:15 PM  Result Value Ref Range   Color, Urine YELLOW YELLOW   APPearance CLOUDY (A) CLEAR   Specific Gravity, Urine 1.023 1.005 - 1.030   pH 6.0 5.0 - 8.0   Glucose, UA NEGATIVE NEGATIVE mg/dL   Hgb urine dipstick NEGATIVE NEGATIVE   Bilirubin Urine NEGATIVE NEGATIVE   Ketones, ur NEGATIVE NEGATIVE mg/dL   Protein, ur NEGATIVE NEGATIVE mg/dL   Nitrite NEGATIVE NEGATIVE   Leukocytes,Ua TRACE (A) NEGATIVE   RBC / HPF 0-5 0 - 5 RBC/hpf   WBC, UA 0-5 0 - 5 WBC/hpf   Bacteria, UA RARE (A) NONE SEEN   Squamous Epithelial / HPF 0-5 0 - 5 /HPF   Mucus PRESENT    Amorphous Crystal PRESENT    Ca Oxalate Crys, UA PRESENT   Wet prep, genital     Status: Abnormal   Collection Time: 11/14/24  4:15 PM   Specimen: Urine, Clean Catch  Result Value Ref Range   Yeast Wet Prep HPF POC NONE SEEN NONE SEEN   Trich, Wet Prep NONE SEEN NONE SEEN   Clue Cells Wet Prep HPF POC NONE  SEEN NONE SEEN   WBC, Wet Prep HPF POC >=10 (A) <10   Sperm NONE SEEN     Imaging:  No results found.  MDM & MAU COURSE  MDM: Low  MAU Course: Orders Placed This Encounter  Procedures   Wet prep, genital   Urinalysis, Routine w reflex microscopic -Urine, Clean Catch   Discharge patient   Meds ordered this encounter  Medications   docusate sodium  (COLACE) 100 MG capsule    Sig: Take 1 capsule (100 mg total) by mouth 2 (two) times daily as needed.    Dispense:  30 capsule    Refill:  2   albuterol  (VENTOLIN  HFA) 108 (90 Base) MCG/ACT inhaler    Sig: Inhale 2 puffs into the lungs every 4 (four) hours as needed for wheezing or shortness of breath.    Dispense:  1 each    Refill:  0   VSS. Exam showing TTP in LLQ. Unable to recall her last bowel movement and has had issues with constipation during this pregnancy. Offered enema  or suppository here to assist, but patient declined. Will send prescription for Colace and advised to take Miralax  daily to help with constipation.Wet prep and UA unremarkable. Also previously counseled to stop zofran  due to the side effect of constipation, reiterated this. All questions answered prior to discharge. Follow up with OB as scheduled next week.   ASSESSMENT   1. Encounter for supervision of normal first pregnancy in second trimester   2. Maternal morbid obesity, antepartum (HCC)   3. [redacted] weeks gestation of pregnancy   4. Slow transit constipation   5. Drug-induced constipation     PLAN  Discharge home in stable condition with return precautions.  Follow up with OB as scheduled.    Allergies as of 11/14/2024       Reactions   Lemon Flavoring Agent (non-screening)    Pineapple    Strawberry Flavoring Agent (non-screening)         Medication List     STOP taking these medications    hydrOXYzine  25 MG tablet Commonly known as: ATARAX    metroNIDAZOLE  500 MG tablet Commonly known as: FLAGYL    ondansetron  4 MG disintegrating tablet Commonly known as: ZOFRAN -ODT       TAKE these medications    albuterol  108 (90 Base) MCG/ACT inhaler Commonly known as: VENTOLIN  HFA Inhale 2 puffs into the lungs every 4 (four) hours as needed for wheezing or shortness of breath.   aspirin  EC 81 MG tablet Take 1 tablet (81 mg total) by mouth at bedtime. Start taking when you are [redacted] weeks pregnant for rest of pregnancy for prevention of preeclampsia   docusate sodium  100 MG capsule Commonly known as: COLACE Take 1 capsule (100 mg total) by mouth 2 (two) times daily as needed.   polyethylene glycol powder 17 GM/SCOOP powder Commonly known as: MiraLax  Take 17 g by mouth daily. Dissolve 1 capful (17g) in 4-8 ounces of liquid and take by mouth daily.        Charlie Courts, MD  Family Medicine - Obstetrics Fellow        [1]  Social History Tobacco Use   Smoking  status: Every Day    Types: Cigars    Passive exposure: Never   Smokeless tobacco: Never  Vaping Use   Vaping status: Never Used  Substance Use Topics   Alcohol use: Not Currently    Comment: occasional   Drug use: Not Currently  [2]  Allergies  Allergen Reactions   Lemon Flavoring Agent (Non-Screening)    Pineapple    Strawberry Flavoring Agent (Non-Screening)

## 2024-11-15 LAB — GC/CHLAMYDIA PROBE AMP (~~LOC~~) NOT AT ARMC
Chlamydia: NEGATIVE
Comment: NEGATIVE
Comment: NORMAL
Neisseria Gonorrhea: NEGATIVE

## 2024-11-16 ENCOUNTER — Other Ambulatory Visit

## 2024-11-16 ENCOUNTER — Ambulatory Visit

## 2024-11-22 ENCOUNTER — Ambulatory Visit

## 2024-11-22 ENCOUNTER — Other Ambulatory Visit

## 2024-11-22 ENCOUNTER — Encounter: Admitting: Family Medicine

## 2024-11-22 ENCOUNTER — Ambulatory Visit: Admitting: Family Medicine

## 2024-11-22 ENCOUNTER — Ambulatory Visit: Attending: Obstetrics and Gynecology | Admitting: Obstetrics and Gynecology

## 2024-11-22 VITALS — BP 124/58

## 2024-11-22 VITALS — BP 110/74 | HR 84 | Wt 270.8 lb

## 2024-11-22 DIAGNOSIS — O99211 Obesity complicating pregnancy, first trimester: Secondary | ICD-10-CM

## 2024-11-22 DIAGNOSIS — Z363 Encounter for antenatal screening for malformations: Secondary | ICD-10-CM | POA: Diagnosis not present

## 2024-11-22 DIAGNOSIS — Z3A20 20 weeks gestation of pregnancy: Secondary | ICD-10-CM | POA: Diagnosis not present

## 2024-11-22 DIAGNOSIS — O99212 Obesity complicating pregnancy, second trimester: Secondary | ICD-10-CM | POA: Diagnosis not present

## 2024-11-22 DIAGNOSIS — Z3401 Encounter for supervision of normal first pregnancy, first trimester: Secondary | ICD-10-CM | POA: Diagnosis present

## 2024-11-22 DIAGNOSIS — Z3402 Encounter for supervision of normal first pregnancy, second trimester: Secondary | ICD-10-CM

## 2024-11-22 DIAGNOSIS — O99210 Obesity complicating pregnancy, unspecified trimester: Secondary | ICD-10-CM

## 2024-11-22 DIAGNOSIS — Z3A12 12 weeks gestation of pregnancy: Secondary | ICD-10-CM | POA: Diagnosis present

## 2024-11-22 DIAGNOSIS — E669 Obesity, unspecified: Secondary | ICD-10-CM

## 2024-11-22 NOTE — Progress Notes (Signed)
" ° °  PRENATAL VISIT NOTE  Subjective:  Katherine Escobar is a 24 y.o. G1P0000 at [redacted]w[redacted]d being seen today for ongoing prenatal care.  She is currently monitored for the following issues for this low-risk pregnancy and has Supervision of normal pregnancy and Maternal morbid obesity, antepartum (HCC) on their problem list.  Patient reports no complaints.  Contractions: Not present. Vag. Bleeding: None.  Movement: Present. Denies leaking of fluid.   The following portions of the patient's history were reviewed and updated as appropriate: allergies, current medications, past family history, past medical history, past social history, past surgical history and problem list.   Objective:   Vitals:   11/22/24 1606  BP: 110/74  Pulse: 84  Weight: 270 lb 12.8 oz (122.8 kg)    Fetal Status:  Fetal Heart Rate (bpm): 154   Movement: Present    General: Alert, oriented and cooperative. Patient is in no acute distress.  Skin: Skin is warm and dry. No rash noted.   Cardiovascular: Normal heart rate noted  Respiratory: Normal respiratory effort, no problems with respiration noted  Abdomen: Soft, gravid, appropriate for gestational age.  Pain/Pressure: Absent     Pelvic: Cervical exam deferred        Extremities: Normal range of motion.  Edema: None  Mental Status: Normal mood and affect. Normal behavior. Normal judgment and thought content.      09/27/2024    3:27 PM  Depression screen PHQ 2/9  Decreased Interest 1  Down, Depressed, Hopeless 0  PHQ - 2 Score 1  Altered sleeping 2  Tired, decreased energy 2  Change in appetite 1  Feeling bad or failure about yourself  0  Trouble concentrating 0  Moving slowly or fidgety/restless 0  Suicidal thoughts 0  PHQ-9 Score 6        09/27/2024    3:27 PM  GAD 7 : Generalized Anxiety Score  Nervous, Anxious, on Edge 0   Control/stop worrying 0   Worry too much - different things 0   Trouble relaxing 0   Restless 0   Easily annoyed or irritable 0    Afraid - awful might happen 0   Total GAD 7 Score 0     Data saved with a previous flowsheet row definition    Assessment and Plan:  Pregnancy: G1P0000 at [redacted]w[redacted]d 1. Encounter for supervision of normal first pregnancy in second trimester (Primary) Continue prenatal care. Anatomy us  today, incomplete has f/u in 5 weeks  2. [redacted] weeks gestation of pregnancy   General obstetric precautions including but not limited to vaginal bleeding, contractions, leaking of fluid and fetal movement were reviewed in detail with the patient. Please refer to After Visit Summary for other counseling recommendations.   Return in 4 weeks (on 12/20/2024).  Future Appointments  Date Time Provider Department Center  12/13/2024 11:15 AM Anyanwu, Gloris LABOR, MD [facility]  12/27/2024  2:15 PM [facility] PROVIDER 1 [facility]  12/27/2024  2:30 PM [facility] US3 [facility]    Glenys GORMAN Birk, MD  "

## 2024-11-22 NOTE — Progress Notes (Signed)
 After review, MFM consult with provider is not indicated for today  Arna Ranks, MD 11/22/2024 4:14 PM  [facility]

## 2024-12-13 ENCOUNTER — Encounter: Admitting: Obstetrics & Gynecology

## 2024-12-27 ENCOUNTER — Ambulatory Visit
